# Patient Record
Sex: Female | Born: 1980 | Race: Black or African American | Hispanic: No | Marital: Single | Smoking: Current some day smoker
Health system: Southern US, Community
[De-identification: ages and names within clinical notes are randomized; demographics above are authoritative.]

## PROBLEM LIST (undated history)

## (undated) DIAGNOSIS — T4145XA Adverse effect of unspecified anesthetic, initial encounter: Secondary | ICD-10-CM

## (undated) DIAGNOSIS — Z8614 Personal history of Methicillin resistant Staphylococcus aureus infection: Secondary | ICD-10-CM

## (undated) DIAGNOSIS — Z9889 Other specified postprocedural states: Secondary | ICD-10-CM

## (undated) DIAGNOSIS — I10 Essential (primary) hypertension: Secondary | ICD-10-CM

## (undated) DIAGNOSIS — F419 Anxiety disorder, unspecified: Secondary | ICD-10-CM

## (undated) DIAGNOSIS — T8859XA Other complications of anesthesia, initial encounter: Secondary | ICD-10-CM

## (undated) DIAGNOSIS — R112 Nausea with vomiting, unspecified: Secondary | ICD-10-CM

## (undated) HISTORY — DX: Essential (primary) hypertension: I10

---

## 2004-04-29 ENCOUNTER — Ambulatory Visit: Payer: Self-pay

## 2004-07-09 ENCOUNTER — Observation Stay: Payer: Self-pay | Admitting: Obstetrics & Gynecology

## 2004-07-14 ENCOUNTER — Inpatient Hospital Stay: Payer: Self-pay | Admitting: Obstetrics & Gynecology

## 2008-01-12 ENCOUNTER — Observation Stay: Payer: Self-pay | Admitting: Obstetrics & Gynecology

## 2008-01-17 ENCOUNTER — Inpatient Hospital Stay: Payer: Self-pay | Admitting: Unknown Physician Specialty

## 2009-12-25 ENCOUNTER — Ambulatory Visit: Payer: Self-pay | Admitting: Family Medicine

## 2010-04-19 HISTORY — PX: TUBAL LIGATION: SHX77

## 2010-11-23 ENCOUNTER — Inpatient Hospital Stay: Payer: Self-pay

## 2011-09-01 IMAGING — CR DG ABDOMEN 3V
1 series · 4 of 4 positions shown · non-contrast
Comparison: none

REASON FOR EXAM: abdominal bloating
COMMENTS:

[Series 1: view not recorded · 0.17mm/px · 4 of 4 slices shown]
[im 1/4]
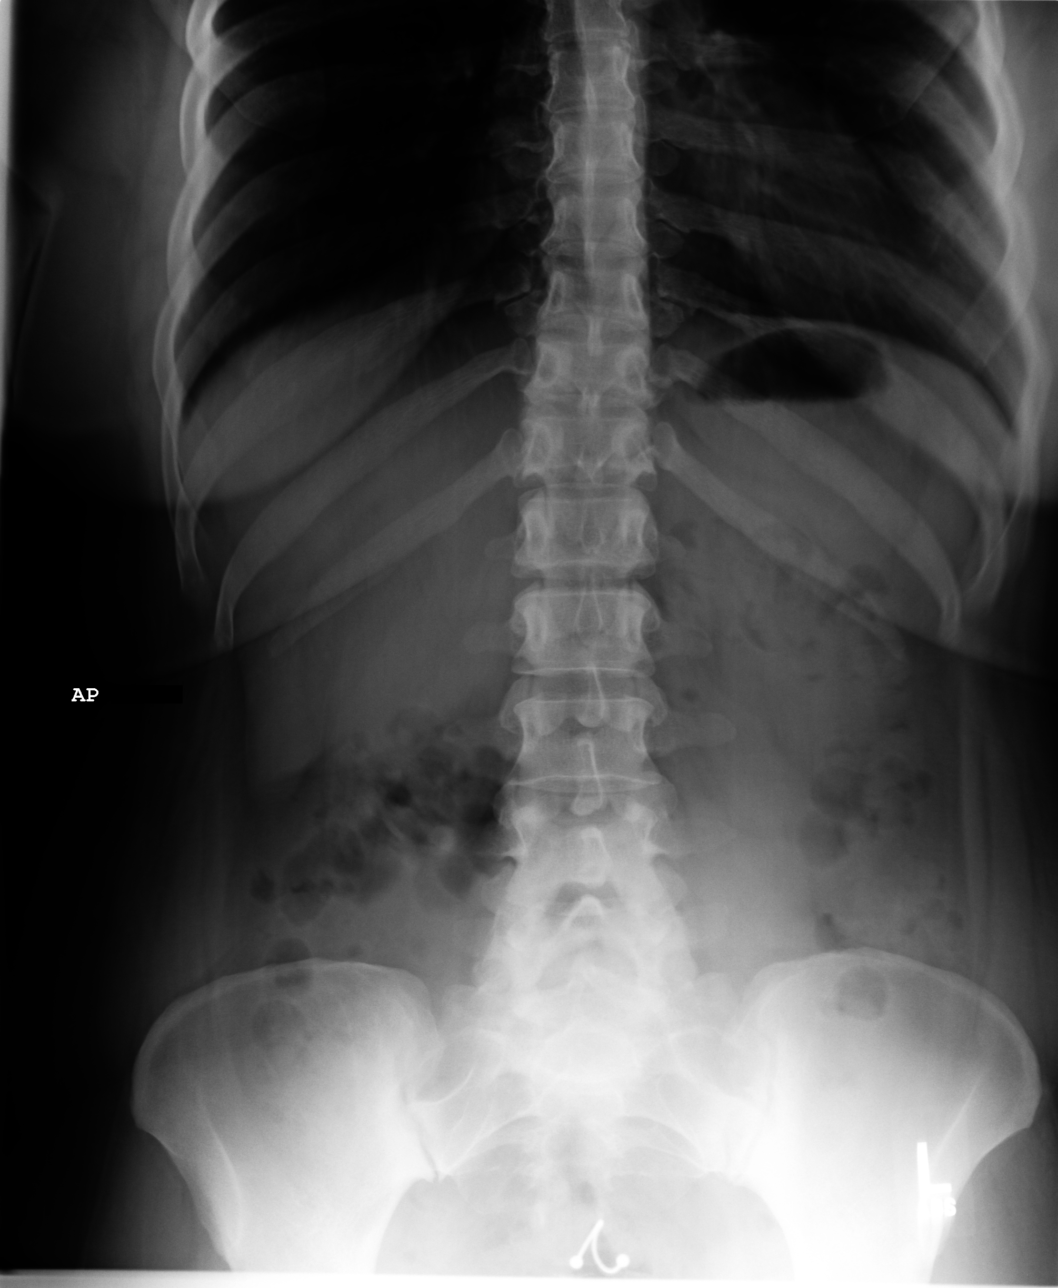
[im 2/4]
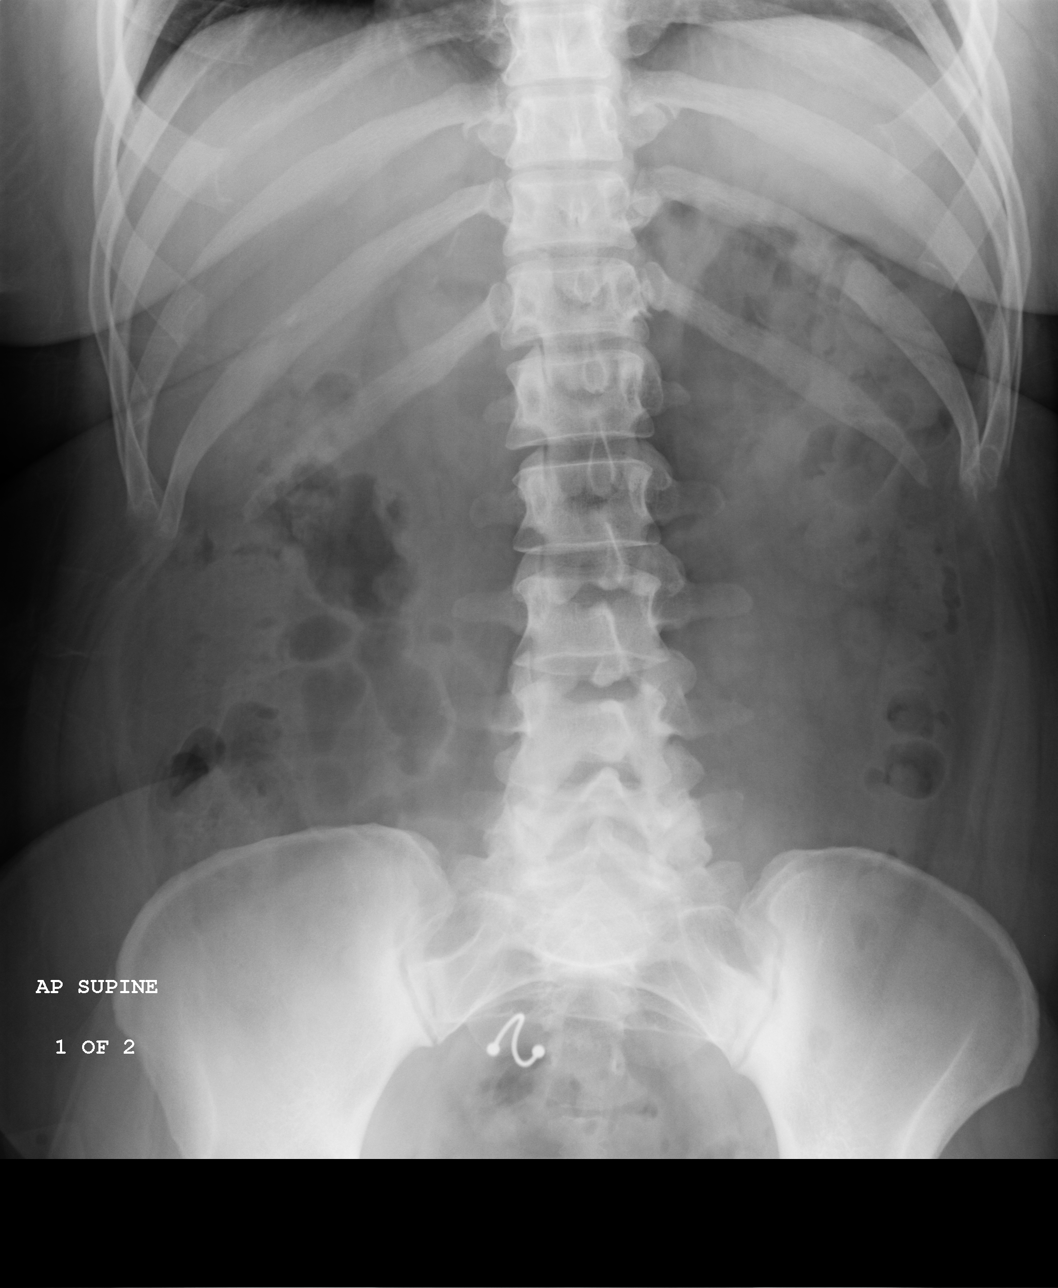
[im 3/4]
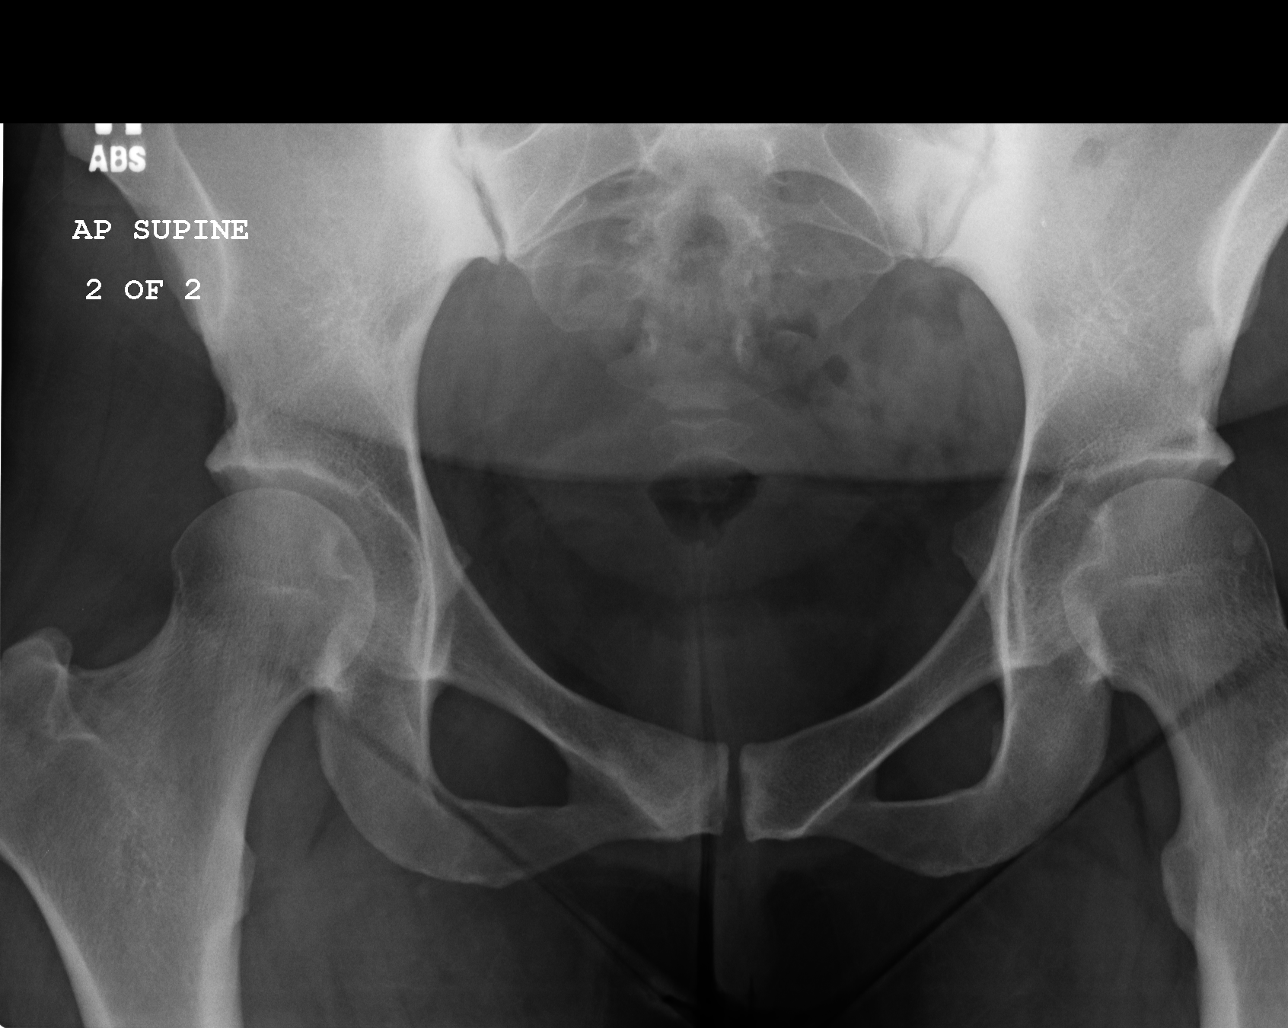
[im 4/4]
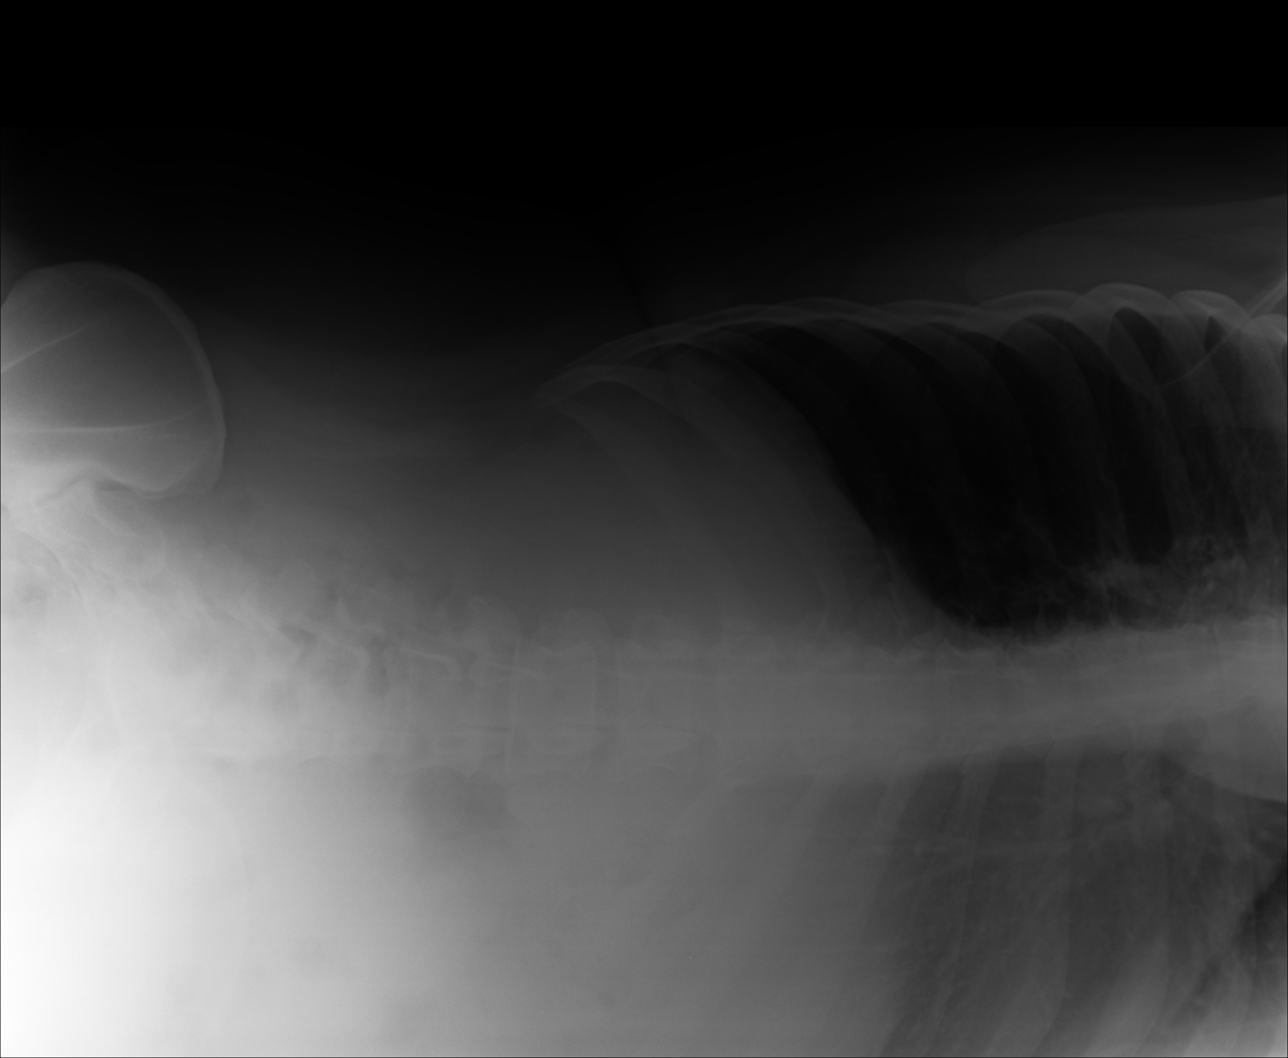

[4 of 4 positions shown; findings below may reference images not displayed]

PROCEDURE:     DXR - DXR ABDOMEN COMPLETE  - December 25, 2009  [DATE]

RESULT:     Flat, erect and lateral decubitus views of the abdomen were
obtained. No subdiaphragmatic free air is seen. The bowel gas pattern is
normal in appearance. No dilated bowel loops suspicious for bowel
obstruction are seen. No abnormal intraabdominal calcifications are noted.
The osseous structures are normal in appearance.
IMPRESSION: 1.     No acute changes are identified.

## 2012-12-29 ENCOUNTER — Emergency Department: Payer: Self-pay | Admitting: Emergency Medicine

## 2012-12-29 LAB — URINALYSIS, COMPLETE
Bacteria: NONE SEEN
Blood: NEGATIVE
Glucose,UR: NEGATIVE mg/dL (ref 0–75)
Ketone: NEGATIVE
Nitrite: NEGATIVE
Ph: 5 (ref 4.5–8.0)
Protein: NEGATIVE
RBC,UR: 2 /HPF (ref 0–5)
Specific Gravity: 1.025 (ref 1.003–1.030)
Squamous Epithelial: 2
WBC UR: 2 /HPF (ref 0–5)

## 2012-12-29 LAB — PREGNANCY, URINE: Pregnancy Test, Urine: NEGATIVE m[IU]/mL

## 2012-12-29 LAB — WET PREP, GENITAL

## 2015-03-07 ENCOUNTER — Emergency Department
Admission: EM | Admit: 2015-03-07 | Discharge: 2015-03-07 | Disposition: A | Discharge status: Home / Self Care | Payer: Medicaid Other | Attending: Emergency Medicine | Admitting: Emergency Medicine

## 2015-03-07 ENCOUNTER — Encounter: Payer: Self-pay | Admitting: Emergency Medicine

## 2015-03-07 DIAGNOSIS — R1013 Epigastric pain: Secondary | ICD-10-CM | POA: Diagnosis present

## 2015-03-07 DIAGNOSIS — K219 Gastro-esophageal reflux disease without esophagitis: Secondary | ICD-10-CM | POA: Insufficient documentation

## 2015-03-07 LAB — BASIC METABOLIC PANEL
Anion gap: 7 (ref 5–15)
BUN: 9 mg/dL (ref 6–20)
CO2: 25 mmol/L (ref 22–32)
Calcium: 8.6 mg/dL — ABNORMAL LOW (ref 8.9–10.3)
Chloride: 106 mmol/L (ref 101–111)
Creatinine, Ser: 0.8 mg/dL (ref 0.44–1.00)
GFR calc Af Amer: >60 mL/min (ref >60)
GFR calc non Af Amer: >60 mL/min (ref >60)
GLUCOSE: 107 mg/dL — AB (ref 65–99)
POTASSIUM: 3.6 mmol/L (ref 3.5–5.1)
Sodium: 138 mmol/L (ref 135–145)

## 2015-03-07 LAB — CBC
HCT: 39 % (ref 35.0–47.0)
Hemoglobin: 13.2 g/dL (ref 12.0–16.0)
MCH: 31.3 pg (ref 26.0–34.0)
MCHC: 33.7 g/dL (ref 32.0–36.0)
MCV: 92.8 fL (ref 80.0–100.0)
Platelets: 200 10*3/uL (ref 150–440)
RBC: 4.2 MIL/uL (ref 3.80–5.20)
RDW: 13.4 % (ref 11.5–14.5)
WBC: 6.3 10*3/uL (ref 3.6–11.0)

## 2015-03-07 LAB — TROPONIN I

## 2015-03-07 MED ORDER — GI COCKTAIL ~~LOC~~
ORAL | Status: AC
  Administered 2015-03-07: 30 mL via ORAL
  Filled 2015-03-07: qty 30

## 2015-03-07 MED ORDER — GI COCKTAIL ~~LOC~~
30.0000 mL | Freq: Once | ORAL | Status: AC
  Administered 2015-03-07: 30 mL via ORAL

## 2015-03-07 MED ORDER — FAMOTIDINE 20 MG PO TABS
ORAL_TABLET | ORAL | Status: AC
  Administered 2015-03-07: 20 mg via ORAL
  Filled 2015-03-07: qty 1

## 2015-03-07 MED ORDER — SUCRALFATE 1 G PO TABS: 1.0000 g | ORAL_TABLET | Freq: Four times a day (QID) | ORAL | Status: DC

## 2015-03-07 MED ORDER — FAMOTIDINE 20 MG PO TABS
20.0000 mg | ORAL_TABLET | Freq: Once | ORAL | Status: AC
  Administered 2015-03-07: 20 mg via ORAL

## 2015-03-07 MED ORDER — FAMOTIDINE 20 MG PO TABS: 20.0000 mg | ORAL_TABLET | Freq: Every day | ORAL | Status: DC

## 2015-03-07 NOTE — ED Provider Notes (Signed)
[facility] Emergency Department Provider Note     Time seen: ----------------------------------------- 8:24 PM on 03/07/2015 -----------------------------------------    I have reviewed the triage vital signs and the nursing notes.   HISTORY  Chief Complaint Abdominal Pain    HPI Monique Maxwell is a 34 y.o. female who presents ER for weeks of intermittent epigastric pain at night and wakes her from sleep. Patient states her sister told her she needed to come to ER to get checked out. Patient states are moist happens when she is laying down or eating. She denies any fevers chills other complaints.   No past medical history on file.  There are no active problems to display for this patient.   Past Surgical History  Procedure Laterality Date  . Tubal ligation      Allergies Review of patient's allergies indicates no known allergies.  Social History Social History  Substance Use Topics  . Smoking status: Never Smoker   . Smokeless tobacco: Never Used  . Alcohol Use: No    Review of Systems Constitutional: Negative for fever. Eyes: Negative for visual changes. ENT: Negative for sore throat. Cardiovascular: Negative for chest pain. Respiratory: Negative for shortness of breath. Gastrointestinal: Positive for abdominal pain Genitourinary: Negative for dysuria. Musculoskeletal: Negative for back pain. Skin: Negative for rash. Neurological: Negative for headaches, focal weakness or numbness.  10-point ROS otherwise negative.  ____________________________________________   PHYSICAL EXAM:  VITAL SIGNS: ED Triage Vitals  Enc Vitals Group     BP 03/07/15 1854 170/82 mmHg     Pulse Rate 03/07/15 1854 113     Resp 03/07/15 1854 18     Temp 03/07/15 1854 98.7 F (37.1 C)     Temp Source 03/07/15 1854 Oral     SpO2 03/07/15 1854 100 %     Weight 03/07/15 1854 150 lb (68.04 kg)     Height 03/07/15 1854 5\' 2"  (1.575 m)     Head Cir --       Peak Flow --      Pain Score --      Pain Loc --      Pain Edu? --      Excl. in GC? --     Constitutional: Alert and oriented. Well appearing and in no distress. Eyes: Conjunctivae are normal. PERRL. Normal extraocular movements. ENT   Head: Normocephalic and atraumatic.   Nose: No congestion/rhinnorhea.   Mouth/Throat: Mucous membranes are moist.   Neck: No stridor. Cardiovascular: Normal rate, regular rhythm. Normal and symmetric distal pulses are present in all extremities. No murmurs, rubs, or gallops. Respiratory: Normal respiratory effort without tachypnea nor retractions. Breath sounds are clear and equal bilaterally. No wheezes/rales/rhonchi. Gastrointestinal: Soft and nontender. No distention. No abdominal bruits.  Musculoskeletal: Nontender with normal range of motion in all extremities. No joint effusions.  No lower extremity tenderness nor edema. Neurologic:  Normal speech and language. No gross focal neurologic deficits are appreciated. Speech is normal. No gait instability. Skin:  Skin is warm, dry and intact. No rash noted. Psychiatric: Mood and affect are normal. Speech and behavior are normal. Patient exhibits appropriate insight and judgment. ____________________________________________  ED COURSE:  Pertinent labs & imaging results that were available during my care of the patient were reviewed by me and considered in my medical decision making (see chart for details). We'll check abdominal labs and reevaluate. Patient clinically with GERD ____________________________________________    LABS (pertinent positives/negatives)  Labs Reviewed  BASIC METABOLIC PANEL -  Abnormal; Notable for the following:    Glucose, Bld 107 (*)    Calcium 8.6 (*)    All other components within normal limits  CBC  TROPONIN I  ____________________________________________  FINAL ASSESSMENT AND PLAN  GERD  Plan: Patient with labs and imaging as dictated above.  Labs are negative, she'll be discharged with Pepcid and Carafate. She was given GI cocktail in the ER. She is stable for outpatient follow-up with her doctor   Emily Filbert, MD   Emily Filbert, MD 03/07/15 2025

## 2015-03-07 NOTE — ED Notes (Signed)
Pt states "for weeks" has had intermittent epigastric pain at night that wakes her from sleep. Pt states "my sister told me i needed to come and get it checked out." pt states pain only happens when she lays down or after eating.

## 2015-03-07 NOTE — Discharge Instructions (Signed)

## 2015-05-30 ENCOUNTER — Emergency Department
Admission: EM | Admit: 2015-05-30 | Discharge: 2015-05-30 | Disposition: A | Discharge status: Home / Self Care | Payer: Medicaid Other | Attending: Emergency Medicine | Admitting: Emergency Medicine

## 2015-05-30 DIAGNOSIS — Z3202 Encounter for pregnancy test, result negative: Secondary | ICD-10-CM | POA: Diagnosis not present

## 2015-05-30 DIAGNOSIS — K8020 Calculus of gallbladder without cholecystitis without obstruction: Secondary | ICD-10-CM | POA: Diagnosis not present

## 2015-05-30 DIAGNOSIS — Z79899 Other long term (current) drug therapy: Secondary | ICD-10-CM | POA: Insufficient documentation

## 2015-05-30 DIAGNOSIS — K8050 Calculus of bile duct without cholangitis or cholecystitis without obstruction: Secondary | ICD-10-CM

## 2015-05-30 DIAGNOSIS — R1011 Right upper quadrant pain: Secondary | ICD-10-CM | POA: Diagnosis present

## 2015-05-30 LAB — CBC
HCT: 40.4 % (ref 35.0–47.0)
HEMOGLOBIN: 13.5 g/dL (ref 12.0–16.0)
MCH: 30.5 pg (ref 26.0–34.0)
MCHC: 33.5 g/dL (ref 32.0–36.0)
MCV: 91.3 fL (ref 80.0–100.0)
PLATELETS: 182 10*3/uL (ref 150–440)
RBC: 4.43 MIL/uL (ref 3.80–5.20)
RDW: 12.9 % (ref 11.5–14.5)
WBC: 4.7 10*3/uL (ref 3.6–11.0)

## 2015-05-30 LAB — COMPREHENSIVE METABOLIC PANEL
ALBUMIN: 4.2 g/dL (ref 3.5–5.0)
ALT: 13 U/L — ABNORMAL LOW (ref 14–54)
ANION GAP: 7 (ref 5–15)
AST: 14 U/L — ABNORMAL LOW (ref 15–41)
Alkaline Phosphatase: 48 U/L (ref 38–126)
BUN: 13 mg/dL (ref 6–20)
CALCIUM: 9 mg/dL (ref 8.9–10.3)
CO2: 26 mmol/L (ref 22–32)
CREATININE: 0.73 mg/dL (ref 0.44–1.00)
Chloride: 104 mmol/L (ref 101–111)
GFR calc non Af Amer: >60 mL/min (ref >60)
Glucose, Bld: 150 mg/dL — ABNORMAL HIGH (ref 65–99)
POTASSIUM: 3.4 mmol/L — AB (ref 3.5–5.1)
SODIUM: 137 mmol/L (ref 135–145)
TOTAL PROTEIN: 7.3 g/dL (ref 6.5–8.1)
Total Bilirubin: 0.3 mg/dL (ref 0.3–1.2)

## 2015-05-30 LAB — URINALYSIS COMPLETE WITH MICROSCOPIC (ARMC ONLY)
BILIRUBIN URINE: NEGATIVE
Bacteria, UA: NONE SEEN
GLUCOSE, UA: NEGATIVE mg/dL
Hgb urine dipstick: NEGATIVE
KETONES UR: NEGATIVE mg/dL
Leukocytes, UA: NEGATIVE
NITRITE: NEGATIVE
PROTEIN: NEGATIVE mg/dL
SPECIFIC GRAVITY, URINE: 1.027 (ref 1.005–1.030)
pH: 5 (ref 5.0–8.0)

## 2015-05-30 LAB — LIPASE, BLOOD: LIPASE: 34 U/L (ref 11–51)

## 2015-05-30 LAB — PREGNANCY, URINE: Preg Test, Ur: NEGATIVE

## 2015-05-30 MED ORDER — ONDANSETRON 4 MG PO TBDP
4.0000 mg | ORAL_TABLET | Freq: Once | ORAL | Status: AC
  Administered 2015-05-30: 4 mg via ORAL
  Filled 2015-05-30: qty 1

## 2015-05-30 MED ORDER — ONDANSETRON HCL 4 MG PO TABS: ORAL_TABLET | ORAL | Status: DC

## 2015-05-30 MED ORDER — DOCUSATE SODIUM 100 MG PO CAPS: ORAL_CAPSULE | ORAL | Status: DC

## 2015-05-30 MED ORDER — HYDROCODONE-ACETAMINOPHEN 5-325 MG PO TABS: 1.0000 | ORAL_TABLET | ORAL | Status: DC | PRN

## 2015-05-30 MED ORDER — HYDROCODONE-ACETAMINOPHEN 5-325 MG PO TABS
2.0000 | ORAL_TABLET | Freq: Once | ORAL | Status: AC
Start: 2015-05-30 — End: 2015-05-30
  Administered 2015-05-30: 2 via ORAL
  Filled 2015-05-30: qty 2

## 2015-05-30 NOTE — ED Provider Notes (Signed)
[facility] Emergency Department Provider Note  ____________________________________________  Time seen: Approximately 4:55 AM  I have reviewed the triage vital signs and the nursing notes.   HISTORY  Chief Complaint Abdominal Pain    HPI Monique Maxwell is a 35 y.o. female presents with right upper quadrant pain.  She reports that she has had intermittent episodes of right upper quadrant pain and epigastric pain for about a year.  She has been seen in this emergency department previously for the same symptoms.  She also saw her primary care doctor recently who scheduled her for an outpatient ultrasound with a [facility] facility.  She obtain this ultrasound yesterday but does not yet have the results.  She reports acute onset of right upper quadrant pain a few hours ago, several hours after eating a meal.  She said that it is now better; it was initially severe but now it is mild.  It is accompanied with nausea but no vomiting.  She denies fever/chills, chest pain, shortness of breath, dysuria.  When it is happening, nothing makes it better and nothing makes it worse, but it did get better on its own.   No past medical history on file.  There are no active problems to display for this patient.   Past Surgical History  Procedure Laterality Date  . Tubal ligation      Current Outpatient Rx  Name  Route  Sig  Dispense  Refill  .           . famotidine (PEPCID) 20 MG tablet   Oral   Take 1 tablet (20 mg total) by mouth daily.   30 tablet   1   .           .           . sucralfate (CARAFATE) 1 G tablet   Oral   Take 1 tablet (1 g total) by mouth 4 (four) times daily.   120 tablet   1     Allergies Review of patient's allergies indicates no known allergies.  No family history on file.  Social History Social History  Substance Use Topics  . Smoking status: Never Smoker   . Smokeless tobacco: Never Used  . Alcohol Use: No    Review of  Systems Constitutional: No fever/chills Eyes: No visual changes. ENT: No sore throat. Cardiovascular: Denies chest pain. Respiratory: Denies shortness of breath. Gastrointestinal: Recurrent right upper quadrant and occasionally epigastric pain with nausea but no vomiting.  No diarrhea. Genitourinary: Negative for dysuria. Musculoskeletal: Negative for back pain. Skin: Negative for rash. Neurological: Negative for headaches, focal weakness or numbness.  10-point ROS otherwise negative.  ____________________________________________   PHYSICAL EXAM:  VITAL SIGNS: ED Triage Vitals  Enc Vitals Group     BP 05/30/15 0240 131/90 mmHg     Pulse Rate 05/30/15 0240 99     Resp 05/30/15 0240 18     Temp 05/30/15 0240 98.2 F (36.8 C)     Temp Source 05/30/15 0240 Oral     SpO2 05/30/15 0240 100 %     Weight 05/30/15 0236 148 lb (67.132 kg)     Height 05/30/15 0236  (1.6 m)     Head Cir --      Peak Flow --      Pain Score 05/30/15 0237 1     Pain Loc --      Pain Edu? --      Excl. in GC? --  Constitutional: Alert and oriented. Well appearing and in no acute distress. Eyes: Conjunctivae are normal. PERRL. EOMI. Head: Atraumatic. Nose: No congestion/rhinnorhea. Mouth/Throat: Mucous membranes are moist.  Oropharynx non-erythematous. Neck: No stridor.   Cardiovascular: Normal rate, regular rhythm. Grossly normal heart sounds.  Good peripheral circulation. Respiratory: Normal respiratory effort.  No retractions. Lungs CTAB. Gastrointestinal: Soft with mild tenderness to palpation of the right upper quadrant but no Murphy sign.  No tenderness to palpation of the lower abdomen.  No epigastric tenderness to palpation. Musculoskeletal: No lower extremity tenderness nor edema.  No joint effusions. Neurologic:  Normal speech and language. No gross focal neurologic deficits are appreciated.  Skin:  Skin is warm, dry and intact. No rash noted. Psychiatric: Mood and affect are  normal. Speech and behavior are normal.  ____________________________________________   LABS (all labs ordered are listed, but only abnormal results are displayed)  Labs Reviewed  COMPREHENSIVE METABOLIC PANEL - Abnormal; Notable for the following:    Potassium 3.4 (*)    Glucose, Bld 150 (*)    AST 14 (*)    ALT 13 (*)    All other components within normal limits  URINALYSIS COMPLETEWITH MICROSCOPIC ([facility] ONLY) - Abnormal; Notable for the following:    Color, Urine YELLOW (*)    APPearance CLEAR (*)    Squamous Epithelial / LPF 0-5 (*)    All other components within normal limits  CBC  LIPASE, BLOOD  PREGNANCY, URINE   ____________________________________________  EKG  None ____________________________________________  RADIOLOGY   No results found.  ____________________________________________   PROCEDURES  Procedure(s) performed: None  Critical Care performed: No ____________________________________________   INITIAL IMPRESSION / ASSESSMENT AND PLAN / ED COURSE  Pertinent labs & imaging results that were available during my care of the patient were reviewed by me and considered in my medical decision making (see chart for details).  Patient's labs are unremarkable and reassuring.  She has no LFT elevation and a normal lipase.  I reviewed her notes in care everywhere and found the results of her ultrasound yesterday.  She has multiple gallstones but no evidence of cholecystitis.  I gave her this information and encouraged very close follow-up with an outpatient surgeon.  She is still having some residual pain at this time but it is mild and she understands the plan for outpatient follow-up and Y emergent or even urgent surgery is not indicated at this time.  I gave her contact information for [facility] and she will call them later today to schedule the next available follow-up appointment.  I gave her prescription for Norco, Zofran, and  Colace.  ____________________________________________  FINAL CLINICAL IMPRESSION(S) / ED DIAGNOSES  Final diagnoses:  Biliary colic      NEW MEDICATIONS STARTED DURING THIS VISIT:  New Prescriptions   DOCUSATE SODIUM (COLACE) 100 MG CAPSULE    Take 1 tablet once or twice daily as needed for constipation while taking narcotic pain medicine   HYDROCODONE-ACETAMINOPHEN (NORCO/VICODIN) 5-325 MG TABLET    Take 1-2 tablets by mouth every 4 (four) hours as needed for moderate pain.   ONDANSETRON (ZOFRAN) 4 MG TABLET    Take 1-2 tabs by mouth every 8 hours as needed for nausea/vomiting     Loleta Rose, MD 05/30/15 0510

## 2015-05-30 NOTE — Discharge Instructions (Signed)
You have been seen in the Emergency Department (ED) for abdominal pain.  Your evaluation suggests that your pain is caused by gallstones.  Fortunately you do not need immediate surgery at this time, but it is important that you follow up with a surgeon as an outpatient; typically surgical removal of the gallbladder is the only thing that will definitively fix your issue.  Read through the included information about a bland diet, and use any prescribed medications as instructed.  Avoid smoking and alcohol use.  When you call the surgery office, explain that your outpatient ultrasound at [facility] showed multiple gallstones and that you would like to discuss having your gallbladder removed with either Dr. Everlene Farrier or one of his colleagues.  Please follow up as instructed above regarding todays emergent visit and the symptoms that are bothering you.  Take Norco as prescribed. Do not drink alcohol, drive or participate in any other potentially dangerous activities while taking this medication as it may make you sleepy. Do not take this medication with any other sedating medications, either prescription or over-the-counter. If you were prescribed Percocet or Vicodin, do not take these with acetaminophen (Tylenol) as it is already contained within these medications.   This medication is an opiate (or narcotic) pain medication and can be habit forming.  Use it as little as possible to achieve adequate pain control.  Do not use or use it with extreme caution if you have a history of opiate abuse or dependence.  If you are on a pain contract with your primary care doctor or a pain specialist, be sure to let them know you were prescribed this medication today from the [facility] Emergency Department.  This medication is intended for your use only - do not give any to anyone else and keep it in a secure place where nobody else, especially children, have access to it.  It will also cause or worsen constipation, so you  may want to consider taking an over-the-counter stool softener while you are taking this medication.  Return to the ED if your abdominal pain worsens or fails to improve, you develop bloody vomiting, bloody diarrhea, you are unable to tolerate fluids due to vomiting, fever greater than 101, or other symptoms that concern you.   Cholelithiasis Cholelithiasis (also called gallstones) is a form of gallbladder disease in which gallstones form in your gallbladder. The gallbladder is an organ that stores bile made in the liver, which helps digest fats. Gallstones begin as small crystals and slowly grow into stones. Gallstone pain occurs when the gallbladder spasms and a gallstone is blocking the duct. Pain can also occur when a stone passes out of the duct.  RISK FACTORS  Being female.   Having multiple pregnancies. Health care providers sometimes advise removing diseased gallbladders before future pregnancies.   Being obese.  Eating a diet heavy in fried foods and fat.   Being older than 60 years and increasing age.   Prolonged use of medicines containing female hormones.   Having diabetes mellitus.   Rapidly losing weight.   Having a family history of gallstones (heredity).  SYMPTOMS  Nausea.   Vomiting.  Abdominal pain.   Yellowing of the skin (jaundice).   Sudden pain. It may persist from several minutes to several hours.  Fever.   Tenderness to the touch. In some cases, when gallstones do not move into the bile duct, people have no pain or symptoms. These are called "silent" gallstones.  TREATMENT Silent gallstones do not  need treatment. In severe cases, emergency surgery may be required. Options for treatment include:  Surgery to remove the gallbladder. This is the most common treatment.  Medicines. These do not always work and may take 6-12 months or more to work.  Shock wave treatment (extracorporeal biliary lithotripsy). In this treatment an ultrasound  machine sends shock waves to the gallbladder to break gallstones into smaller pieces that can pass into the intestines or be dissolved by medicine. HOME CARE INSTRUCTIONS   Only take over-the-counter or prescription medicines for pain, discomfort, or fever as directed by your health care provider.   Follow a low-fat diet until seen again by your health care provider. Fat causes the gallbladder to contract, which can result in pain.   Follow up with your health care provider as directed. Attacks are almost always recurrent and surgery is usually required for permanent treatment.  SEEK IMMEDIATE MEDICAL CARE IF:   Your pain increases and is not controlled by medicines.   You have a fever or persistent symptoms for more than 2-3 days.   You have a fever and your symptoms suddenly get worse.   You have persistent nausea and vomiting.  MAKE SURE YOU:   Understand these instructions.  Will watch your condition.  Will get help right away if you are not doing well or get worse.   This information is not intended to replace advice given to you by your health care provider. Make sure you discuss any questions you have with your health care provider.   Document Released: 04/01/2005 Document Revised: 12/06/2012 Document Reviewed: 09/27/2012 Elsevier Interactive Patient Education [employer]

## 2015-05-30 NOTE — ED Notes (Signed)
Pt in with co ruq pain since last year, had US done yest but has not gotten results.

## 2015-06-04 ENCOUNTER — Other Ambulatory Visit: Payer: Self-pay

## 2015-06-05 ENCOUNTER — Encounter: Payer: Self-pay | Admitting: General Surgery

## 2015-06-05 ENCOUNTER — Ambulatory Visit (INDEPENDENT_AMBULATORY_CARE_PROVIDER_SITE_OTHER): Payer: Medicaid Other | Admitting: General Surgery

## 2015-06-05 VITALS — BP 141/89 | HR 91 | Temp 98.9°F | Ht 63.0 in | Wt 151.6 lb

## 2015-06-05 DIAGNOSIS — R1011 Right upper quadrant pain: Secondary | ICD-10-CM

## 2015-06-05 NOTE — Patient Instructions (Signed)
You have requested to have your Gallbladder removed. We will arrange this to be done on 07/03/15 with Dr. Tonita Cong at [facility].   You will be off from work for approximately 1-2 weeks depending on your recovery.   Please avoid greasy and fried foods if at all possible prior to your scheduled surgery to decrease symptoms until then.  If your pain becomes too severe or will not go away, come to the Emergency Room at [facility]- we have a surgeon on call 24 hours a day, 7 days a week.  Do not take your Diet Pill - 1 week prior to surgery.  Please see the [facility]) pre-care form you have been given today.  If you have any questions or concerns please call our office.

## 2015-06-05 NOTE — Progress Notes (Signed)
Patient ID: Monique Maxwell, female   DOB: Aug 11, 1980, 35 y.o.   MRN: [id number]  CC: ABDOMINAL PAIN  HPI Monique Maxwell is a 35 y.o. female  Presents to clinic today for evaluation of abdominal pain. Patient states she's been having intermittent right-sided abdominal pain over the last year and a half. Partially once every couple weeks she'll have severe right upper quadrant pain that shoots up to her right shoulder from her right upper abdomen at the worst pain causes nausea but never vomiting. She also has a chronic history of constipation and normal for her is having a bowel movement every 2 weeks. She denies any fevers, chills, chest pain, shortness breath, nausea, vomiting or current abdominal pain. She's had numerous workups for this and ultrasound that showed gallstones.  HPI  Past Medical History  Diagnosis Date  . Hypertension     Past Surgical History  Procedure Laterality Date  . Tubal ligation  2012    Family History  Problem Relation Age of Onset  . Hypertension Father   . Cancer Father     Colon Cancer  . Cancer Sister     Ovarian Cancer    Social History Social History  Substance Use Topics  . Smoking status: Current Every Day Smoker -- 0.25 packs/day    Types: Cigarettes  . Smokeless tobacco: Never Used  . Alcohol Use: Yes     Comment: Occasional    No Known Allergies  Current Outpatient Prescriptions  Medication Sig Dispense Refill  . HYDROcodone-acetaminophen (NORCO/VICODIN) 5-325 MG tablet Take 1-2 tablets by mouth every 4 (four) hours as needed for moderate pain. 15 tablet 0  . lisinopril-hydrochlorothiazide (PRINZIDE,ZESTORETIC) 10-12.5 MG tablet Take 1 tablet by mouth daily.  5  . ondansetron (ZOFRAN) 4 MG tablet Take 1-2 tabs by mouth every 8 hours as needed for nausea/vomiting 30 tablet 0  . phentermine (ADIPEX-P) 37.5 MG tablet Take 1 tablet by mouth daily.  1   No current facility-administered medications for this visit.     Review of  Systems A  Multi-point review of systems was asked and was negative except for the findings documented in the history of present illness  Physical Exam Blood pressure 141/89, pulse 91, temperature 98.9 F (37.2 C), temperature source Oral, height  (1.6 m), weight 68.765 kg (151 lb 9.6 oz), last menstrual period 05/04/2015. CONSTITUTIONAL:  No acute distress. EYES: Pupils are equal, round, and reactive to light, Sclera are non-icteric. EARS, NOSE, MOUTH AND THROAT: The oropharynx is clear. The oral mucosa is pink and moist. Hearing is intact to voice. LYMPH NODES:  Lymph nodes in the neck are normal. RESPIRATORY:  Lungs are clear. There is normal respiratory effort, with equal breath sounds bilaterally, and without pathologic use of accessory muscles. CARDIOVASCULAR: Heart is regular without murmurs, gallops, or rubs. GI: The abdomen is soft, nontender, and nondistended. There are no palpable masses. There is no hepatosplenomegaly. There are normal bowel sounds in all quadrants. GU: Rectal deferred.   MUSCULOSKELETAL: Normal muscle strength and tone. No cyanosis or edema.   SKIN: Turgor is good and there are no pathologic skin lesions or ulcers. NEUROLOGIC: Motor and sensation is grossly normal. Cranial nerves are grossly intact. PSYCH:  Oriented to person, place and time. Affect is normal.  Data Reviewed  labs reviewed from the emergency department are all within normal limits , ultrasound was performed at [facility] without images currently visible however the report shows cholelithiasis without cholecystitis. I have personally reviewed the  patient's imaging, laboratory findings and medical records.    Assessment     cholelithiasis     Plan    I had a long conversation with the patient that her symptoms are not classic for gallstones however patient is convinced that it is her gallbladder so the surgery was discussed. I discussed the procedure in detail.  The patient was given  Agricultural engineer.  We discussed the risks and benefits of a laparoscopic cholecystectomy and possible cholangiogram including, but not limited to bleeding, infection, injury to surrounding structures such as the intestine or liver, bile leak, retained gallstones, need to convert to an open procedure, prolonged diarrhea, blood clots such as  DVT, common bile duct injury, anesthesia risks, and possible need for additional procedures.  The likelihood of improvement in symptoms and return to the patient's normal status is good. We discussed the typical post-operative recovery course.    Plan for surgical intervention on March 16th. Patient voiced understanding of this may not relieve her symptoms.     Time spent with the patient was 60 minutes, with more than 50% of the time spent in face-to-face education, counseling and care coordination.     Ricarda Frame, MD FACS General Surgeon 06/05/2015, 3:57 PM

## 2015-06-06 ENCOUNTER — Telehealth: Payer: Self-pay | Admitting: General Surgery

## 2015-06-06 NOTE — Telephone Encounter (Signed)
I have called Pt advise her of pre op date/time and sx date, No answer. I have left a detailed message.  Sx: 07/03/15 with Dr Devoria Albe Cholecystectomy. Pre op: 06/19/15 between 9-1:00pm--Phone.

## 2015-06-09 ENCOUNTER — Telehealth: Payer: Self-pay | Admitting: General Surgery

## 2015-06-09 NOTE — Telephone Encounter (Signed)
Patient has been advised of all surgery information. SX: 07/03/15 with Dr Cleotilde Neer cholecystectomy. Phone pre op: 06/19/15 between 9-1 phone.

## 2015-06-26 ENCOUNTER — Other Ambulatory Visit: Payer: Medicaid Other

## 2015-06-26 ENCOUNTER — Encounter: Payer: Self-pay | Admitting: *Deleted

## 2015-06-26 NOTE — Patient Instructions (Signed)
  Your procedure is scheduled on: 07-03-15 (THURSDAY) Report to [facility] 2ND FLOOR To find out your arrival time please call [phone] between 1PM - 3PM on 07-02-15 [facility](WEDNESDAY)  Remember: Instructions that are not followed completely may result in serious medical risk, up to and including death, or upon the discretion of your surgeon and anesthesiologist your surgery may need to be rescheduled.    _X__ 1. Do not eat food or drink liquids after midnight. No gum chewing or hard candies.     _X__ 2. No Alcohol for 24 hours before or after surgery.   ____ 3. Bring all medications with you on the day of surgery if instructed.    _X__ 4. Notify your doctor if there is any change in your medical condition     (cold, fever, infections).     Do not wear jewelry, make-up, hairpins, clips or nail polish.  Do not wear lotions, powders, or perfumes. You may wear deodorant.  Do not shave 48 hours prior to surgery. Men may shave face and neck.  Do not bring valuables to the hospital.    [facility] is not responsible for any belongings or valuables.               Contacts, dentures or bridgework may not be worn into surgery.  Leave your suitcase in the car. After surgery it may be brought to your room.  For patients admitted to the hospital, discharge time is determined by your reatment team.   Patients discharged the day of surgery will not be allowed to drive home.   Please read over the following fact sheets that you were given:     ____ Take these medicines the morning of surgery with A SIP OF WATER:    1. NONE  2.   3.   4.  5.  6.  ____ Fleet Enema (as directed)   _X__ Use CHG Soap as directed  ____ Use inhalers on the day of surgery  ____ Stop metformin 2 days prior to surgery    ____ Take 1/2 of usual insulin dose the night before surgery and none on the morning of surgery.   ____ Stop Coumadin/Plavix/aspirin-N/A  ____ Stop Anti-inflammatories-NO  NSAIDS OR ASPIRIN PRODUCTS-HYDROCODONE/TYLENOL OK TO TAKE   ____ Stop supplements until after surgery.    ____ Bring C-Pap to the hospital.

## 2015-06-30 ENCOUNTER — Encounter
Admission: RE | Admit: 2015-06-30 | Discharge: 2015-06-30 | Disposition: A | Discharge status: Home / Self Care | Payer: Medicaid Other | Source: Ambulatory Visit | Attending: General Surgery | Admitting: General Surgery

## 2015-06-30 ENCOUNTER — Ambulatory Visit
Admission: RE | Admit: 2015-06-30 | Discharge: 2015-06-30 | Disposition: A | Discharge status: Home / Self Care | Payer: Medicaid Other | Source: Ambulatory Visit | Attending: General Surgery | Admitting: General Surgery

## 2015-06-30 DIAGNOSIS — Z01818 Encounter for other preprocedural examination: Secondary | ICD-10-CM | POA: Diagnosis not present

## 2015-06-30 DIAGNOSIS — Z01812 Encounter for preprocedural laboratory examination: Secondary | ICD-10-CM | POA: Diagnosis present

## 2015-06-30 DIAGNOSIS — Z01810 Encounter for preprocedural cardiovascular examination: Secondary | ICD-10-CM

## 2015-06-30 LAB — SURGICAL PCR SCREEN
MRSA, PCR: NEGATIVE
STAPHYLOCOCCUS AUREUS: NEGATIVE

## 2015-06-30 LAB — POTASSIUM: Potassium: 3.2 mmol/L — ABNORMAL LOW (ref 3.5–5.1)

## 2015-07-03 ENCOUNTER — Encounter: Payer: Self-pay | Admitting: *Deleted

## 2015-07-03 ENCOUNTER — Encounter
Admission: RE | Disposition: A | Discharge status: Home / Self Care | Payer: Self-pay | Source: Ambulatory Visit | Attending: General Surgery

## 2015-07-03 ENCOUNTER — Ambulatory Visit: Payer: Medicaid Other | Admitting: Certified Registered Nurse Anesthetist

## 2015-07-03 ENCOUNTER — Ambulatory Visit
Admission: RE | Admit: 2015-07-03 | Discharge: 2015-07-03 | Disposition: A | Discharge status: Home / Self Care | Payer: Medicaid Other | Source: Ambulatory Visit | Attending: General Surgery | Admitting: General Surgery

## 2015-07-03 DIAGNOSIS — K8010 Calculus of gallbladder with chronic cholecystitis without obstruction: Secondary | ICD-10-CM | POA: Diagnosis present

## 2015-07-03 DIAGNOSIS — Z800 Family history of malignant neoplasm of digestive organs: Secondary | ICD-10-CM | POA: Diagnosis not present

## 2015-07-03 DIAGNOSIS — I10 Essential (primary) hypertension: Secondary | ICD-10-CM | POA: Diagnosis not present

## 2015-07-03 DIAGNOSIS — K5900 Constipation, unspecified: Secondary | ICD-10-CM | POA: Insufficient documentation

## 2015-07-03 DIAGNOSIS — F17210 Nicotine dependence, cigarettes, uncomplicated: Secondary | ICD-10-CM | POA: Insufficient documentation

## 2015-07-03 DIAGNOSIS — Z8249 Family history of ischemic heart disease and other diseases of the circulatory system: Secondary | ICD-10-CM | POA: Insufficient documentation

## 2015-07-03 DIAGNOSIS — Z79899 Other long term (current) drug therapy: Secondary | ICD-10-CM | POA: Diagnosis not present

## 2015-07-03 DIAGNOSIS — R1011 Right upper quadrant pain: Secondary | ICD-10-CM | POA: Diagnosis not present

## 2015-07-03 DIAGNOSIS — K8021 Calculus of gallbladder without cholecystitis with obstruction: Secondary | ICD-10-CM | POA: Diagnosis not present

## 2015-07-03 DIAGNOSIS — Z8041 Family history of malignant neoplasm of ovary: Secondary | ICD-10-CM | POA: Insufficient documentation

## 2015-07-03 DIAGNOSIS — M25511 Pain in right shoulder: Secondary | ICD-10-CM | POA: Insufficient documentation

## 2015-07-03 HISTORY — DX: Other specified postprocedural states: Z98.890

## 2015-07-03 HISTORY — DX: Nausea with vomiting, unspecified: R11.2

## 2015-07-03 HISTORY — DX: Anxiety disorder, unspecified: F41.9

## 2015-07-03 HISTORY — DX: Personal history of Methicillin resistant Staphylococcus aureus infection: Z86.14

## 2015-07-03 HISTORY — DX: Adverse effect of unspecified anesthetic, initial encounter: T41.45XA

## 2015-07-03 HISTORY — DX: Other complications of anesthesia, initial encounter: T88.59XA

## 2015-07-03 HISTORY — PX: ROBOTIC ASSISTED LAPAROSCOPIC CHOLECYSTECTOMY: SHX6521

## 2015-07-03 LAB — POCT I-STAT 4, (NA,K, GLUC, HGB,HCT)
GLUCOSE: 102 mg/dL — AB (ref 65–99)
HEMATOCRIT: 41 % (ref 36.0–46.0)
HEMOGLOBIN: 13.9 g/dL (ref 12.0–15.0)
POTASSIUM: 3.1 mmol/L — AB (ref 3.5–5.1)
Sodium: 139 mmol/L (ref 135–145)

## 2015-07-03 LAB — POCT PREGNANCY, URINE: Preg Test, Ur: NEGATIVE

## 2015-07-03 SURGERY — LAPAROSCOPIC CHOLECYSTECTOMY
Anesthesia: Choice

## 2015-07-03 SURGERY — ROBOTIC ASSISTED LAPAROSCOPIC CHOLECYSTECTOMY
Anesthesia: General | Wound class: Clean Contaminated

## 2015-07-03 MED ORDER — FAMOTIDINE 20 MG PO TABS
20.0000 mg | ORAL_TABLET | Freq: Once | ORAL | Status: AC
  Administered 2015-07-03: 20 mg via ORAL

## 2015-07-03 MED ORDER — PHENYLEPHRINE HCL 10 MG/ML IJ SOLN
INTRAMUSCULAR | Status: DC | PRN
  Administered 2015-07-03 (×4): 100 ug via INTRAVENOUS

## 2015-07-03 MED ORDER — DEXAMETHASONE SODIUM PHOSPHATE 10 MG/ML IJ SOLN
INTRAMUSCULAR | Status: DC | PRN
  Administered 2015-07-03 (×2): 5 mg via INTRAVENOUS

## 2015-07-03 MED ORDER — BUPIVACAINE HCL (PF) 0.5 % IJ SOLN
INTRAMUSCULAR | Status: AC
  Filled 2015-07-03: qty 30

## 2015-07-03 MED ORDER — SUCCINYLCHOLINE CHLORIDE 20 MG/ML IJ SOLN
INTRAMUSCULAR | Status: DC | PRN
  Administered 2015-07-03: 100 mg via INTRAVENOUS

## 2015-07-03 MED ORDER — HYDROCODONE-ACETAMINOPHEN 5-325 MG PO TABS
ORAL_TABLET | ORAL | Status: AC
  Administered 2015-07-03: 1
  Filled 2015-07-03: qty 1

## 2015-07-03 MED ORDER — GLYCOPYRROLATE 0.2 MG/ML IJ SOLN
INTRAMUSCULAR | Status: DC | PRN
  Administered 2015-07-03: 0.4 mg via INTRAVENOUS

## 2015-07-03 MED ORDER — ONDANSETRON HCL 4 MG/2ML IJ SOLN
INTRAMUSCULAR | Status: AC
  Administered 2015-07-03: 4 mg via INTRAVENOUS
  Filled 2015-07-03: qty 2

## 2015-07-03 MED ORDER — LIDOCAINE HCL (PF) 1 % IJ SOLN
INTRAMUSCULAR | Status: AC
  Filled 2015-07-03: qty 30

## 2015-07-03 MED ORDER — FENTANYL CITRATE (PF) 100 MCG/2ML IJ SOLN
INTRAMUSCULAR | Status: AC
  Administered 2015-07-03: 25 ug via INTRAVENOUS
  Filled 2015-07-03: qty 2

## 2015-07-03 MED ORDER — ACETAMINOPHEN 10 MG/ML IV SOLN
INTRAVENOUS | Status: DC | PRN
  Administered 2015-07-03: 1000 mg via INTRAVENOUS

## 2015-07-03 MED ORDER — LIDOCAINE HCL (CARDIAC) 20 MG/ML IV SOLN
INTRAVENOUS | Status: DC | PRN
  Administered 2015-07-03: 80 mg via INTRAVENOUS

## 2015-07-03 MED ORDER — FENTANYL CITRATE (PF) 100 MCG/2ML IJ SOLN
25.0000 ug | INTRAMUSCULAR | Status: DC | PRN
  Administered 2015-07-03 (×4): 25 ug via INTRAVENOUS

## 2015-07-03 MED ORDER — LIDOCAINE HCL (PF) 1 % IJ SOLN
INTRAMUSCULAR | Status: DC | PRN
  Administered 2015-07-03: 9 mL

## 2015-07-03 MED ORDER — INDOCYANINE GREEN 25 MG IV SOLR
1.2500 mg | Freq: Once | INTRAVENOUS | Status: AC
  Administered 2015-07-03: 1.25 mg via INTRAVENOUS

## 2015-07-03 MED ORDER — CHLORHEXIDINE GLUCONATE 4 % EX LIQD: 1.0000 "application " | Freq: Once | CUTANEOUS | Status: DC

## 2015-07-03 MED ORDER — FENTANYL CITRATE (PF) 100 MCG/2ML IJ SOLN
INTRAMUSCULAR | Status: DC | PRN
  Administered 2015-07-03: 50 ug via INTRAVENOUS
  Administered 2015-07-03: 100 ug via INTRAVENOUS

## 2015-07-03 MED ORDER — SODIUM CHLORIDE 0.9 % IR SOLN
Status: DC | PRN
  Administered 2015-07-03: 1000 mL

## 2015-07-03 MED ORDER — INDOCYANINE GREEN 25 MG IV SOLR
INTRAVENOUS | Status: AC
  Filled 2015-07-03: qty 25

## 2015-07-03 MED ORDER — POTASSIUM CHLORIDE CRYS ER 20 MEQ PO TBCR
40.0000 meq | EXTENDED_RELEASE_TABLET | Freq: Once | ORAL | Status: AC
Start: 2015-07-03 — End: 2015-07-03
  Administered 2015-07-03: 40 meq via ORAL
  Filled 2015-07-03: qty 2

## 2015-07-03 MED ORDER — BUPIVACAINE HCL (PF) 0.5 % IJ SOLN
INTRAMUSCULAR | Status: DC | PRN
  Administered 2015-07-03: 9 mL

## 2015-07-03 MED ORDER — ROCURONIUM BROMIDE 100 MG/10ML IV SOLN
INTRAVENOUS | Status: DC | PRN
Start: 2015-07-03 — End: 2015-07-03
  Administered 2015-07-03: 5 mg via INTRAVENOUS
  Administered 2015-07-03: 15 mg via INTRAVENOUS
  Administered 2015-07-03 (×3): 5 mg via INTRAVENOUS

## 2015-07-03 MED ORDER — ONDANSETRON HCL 4 MG/2ML IJ SOLN
4.0000 mg | Freq: Once | INTRAMUSCULAR | Status: AC | PRN
  Administered 2015-07-03: 4 mg via INTRAVENOUS

## 2015-07-03 MED ORDER — LACTATED RINGERS IV SOLN
INTRAVENOUS | Status: DC
  Administered 2015-07-03: 07:00:00 via INTRAVENOUS

## 2015-07-03 MED ORDER — HYDROCODONE-ACETAMINOPHEN 5-325 MG PO TABS: 1.0000 | ORAL_TABLET | ORAL | Status: DC | PRN

## 2015-07-03 MED ORDER — ONDANSETRON HCL 4 MG/2ML IJ SOLN
INTRAMUSCULAR | Status: DC | PRN
  Administered 2015-07-03: 4 mg via INTRAVENOUS

## 2015-07-03 MED ORDER — PROPOFOL 10 MG/ML IV BOLUS
INTRAVENOUS | Status: DC | PRN
  Administered 2015-07-03: 150 mg via INTRAVENOUS

## 2015-07-03 MED ORDER — FAMOTIDINE 20 MG PO TABS
ORAL_TABLET | ORAL | Status: AC
  Filled 2015-07-03: qty 1

## 2015-07-03 MED ORDER — NEOSTIGMINE METHYLSULFATE 10 MG/10ML IV SOLN
INTRAVENOUS | Status: DC | PRN
  Administered 2015-07-03: 3 mg via INTRAVENOUS

## 2015-07-03 MED ORDER — ACETAMINOPHEN 10 MG/ML IV SOLN
INTRAVENOUS | Status: AC
  Filled 2015-07-03: qty 100

## 2015-07-03 MED ORDER — DEXTROSE 5 % IV SOLN
1.0000 g | INTRAVENOUS | Status: AC
  Administered 2015-07-03 (×2): 1 g via INTRAVENOUS
  Filled 2015-07-03 (×2): qty 1

## 2015-07-03 MED ORDER — MIDAZOLAM HCL 2 MG/2ML IJ SOLN
INTRAMUSCULAR | Status: DC | PRN
  Administered 2015-07-03: 2 mg via INTRAVENOUS

## 2015-07-03 SURGICAL SUPPLY — 39 items
BLADE SURG SZ11 CARB STEEL (BLADE) ×3 IMPLANT
CANISTER SUCT 1200ML W/VALVE (MISCELLANEOUS) ×3 IMPLANT
CHLORAPREP W/TINT 26ML (MISCELLANEOUS) ×3 IMPLANT
CLIP LIGATING HEMO O LOK GREEN (MISCELLANEOUS) ×6 IMPLANT
CORD BIP STRL DISP 12FT (MISCELLANEOUS) ×3 IMPLANT
COVER TIP SHEARS 8 DVNC (MISCELLANEOUS) ×1 IMPLANT
COVER TIP SHEARS 8MM DA VINCI (MISCELLANEOUS) ×2
DEFOGGER SCOPE WARMER CLEARIFY (MISCELLANEOUS) ×3 IMPLANT
DRAPE 3 ARM ACCESS DA VINCI (DRAPES) ×2
DRAPE 3 ARM ACCESS DVNC (DRAPES) ×1 IMPLANT
ELECT REM PT RETURN 9FT ADLT (ELECTROSURGICAL) ×3
ELECTRODE REM PT RTRN 9FT ADLT (ELECTROSURGICAL) ×1 IMPLANT
GLOVE BIO SURGEON STRL SZ7.5 (GLOVE) ×12 IMPLANT
GLOVE INDICATOR 8.0 STRL GRN (GLOVE) ×6 IMPLANT
GOWN STRL REUS W/ TWL LRG LVL3 (GOWN DISPOSABLE) ×3 IMPLANT
GOWN STRL REUS W/TWL LRG LVL3 (GOWN DISPOSABLE) ×6
IRRIGATION STRYKERFLOW (MISCELLANEOUS) ×1 IMPLANT
IRRIGATOR STRYKERFLOW (MISCELLANEOUS) ×3
IV NS 1000ML (IV SOLUTION) ×2
IV NS 1000ML BAXH (IV SOLUTION) ×1 IMPLANT
KIT PINK PAD W/HEAD ARE REST (MISCELLANEOUS) ×3
KIT PINK PAD W/HEAD ARM REST (MISCELLANEOUS) ×1 IMPLANT
L-HOOK LAP DISP 36CM (ELECTROSURGICAL)
LHOOK LAP DISP 36CM (ELECTROSURGICAL) IMPLANT
LIQUID BAND (GAUZE/BANDAGES/DRESSINGS) ×3 IMPLANT
NEEDLE VERESS 14GA 120MM (NEEDLE) ×3 IMPLANT
NS IRRIG 500ML POUR BTL (IV SOLUTION) ×3 IMPLANT
PACK LAP CHOLECYSTECTOMY (MISCELLANEOUS) ×3 IMPLANT
PENCIL ELECTRO HAND CTR (MISCELLANEOUS) ×3 IMPLANT
POUCH ENDO CATCH 10MM SPEC (MISCELLANEOUS) ×3 IMPLANT
SCISSORS METZENBAUM CVD 33 (INSTRUMENTS) ×3 IMPLANT
SOLUTION ELECTROLUBE (MISCELLANEOUS) ×3 IMPLANT
SUT MNCRL 4-0 (SUTURE) ×2
SUT MNCRL 4-0 27XMFL (SUTURE) ×1
SUT VICRYL 0 AB UR-6 (SUTURE) ×3 IMPLANT
SUTURE MNCRL 4-0 27XMF (SUTURE) ×1 IMPLANT
TROCAR XCEL 12X100 BLDLESS (ENDOMECHANICALS) ×3 IMPLANT
TROCAR XCEL NON-BLD 5MMX100MML (ENDOMECHANICALS) ×3 IMPLANT
TUBING INSUFFLATOR HI FLOW (MISCELLANEOUS) ×3 IMPLANT

## 2015-07-03 NOTE — Interval H&P Note (Signed)
History and Physical Interval Note:  07/03/2015 6:48 AM  Monique Maxwell  has presented today for surgery, with the diagnosis of abdominal pain  The various methods of treatment have been discussed with the patient and family. After consideration of risks, benefits and other options for treatment, the patient has consented to  Procedure(s): ROBOTIC ASSISTED LAPAROSCOPIC CHOLECYSTECTOMY (N/A) as a surgical intervention .  The patient's history has been reviewed, patient examined, no change in status, stable for surgery.  I have reviewed the patient's chart and labs.  Questions were answered to the patient's satisfaction.     Monique Maxwell

## 2015-07-03 NOTE — Op Note (Signed)
Robotic assisted Laparoscopic Cholecystectomy  Pre-operative Diagnosis: Cholelithiasis  Post-operative Diagnosis: Same  Procedure: Robotic assisted laparoscopic cholecystectomy  Surgeon: Leonette Mostharles T. Tonita CongWoodham, MD FACS  Anesthesia: Gen. with endotracheal tube  Assistant: None  Procedure Details  The patient was seen again in the Holding Room. The benefits, complications, treatment options, and expected outcomes were discussed with the patient. The risks of bleeding, infection, recurrence of symptoms, failure to resolve symptoms, bile duct damage, bile duct leak, retained common bile duct stone, bowel injury, any of which could require further surgery and/or ERCP, stent, or papillotomy were reviewed with the patient. The likelihood of improving the patient's symptoms with return to their baseline status is good.  The patient and/or family concurred with the proposed plan, giving informed consent.  The patient was taken to Operating Room, identified as Cortnie J Resch and the procedure verified as Laparoscopic Cholecystectomy.  A Time Out was held and the above information confirmed.  Prior to the induction of general anesthesia, antibiotic prophylaxis was administered. VTE prophylaxis was in place. General endotracheal anesthesia was then administered and tolerated well. After the induction, the abdomen was prepped with Chloraprep and draped in the sterile fashion. The patient was positioned in the supine position.  Local anesthetic  was injected into the skin near the umbilicus and an incision made. Access to the peritoneum was accomplished using the open technique with visualization of the peritoneal cavity prior to inserting a 12 mm trocar. Pneumoperitoneum was then created with CO2 under direct visualization and tolerated well without any adverse changes in the patient's vital signs. A 5mm port was placed in the periumbilical position and the abdominal cavity was explored.  Two 8-mm ports were  placed in the midclavicular line approximately 5 cm cephalad from the umbilicus. An additional 5 mm trocar was placed in the right lower quadrant under direct visualization as well. All skin incisions  were infiltrated with a local anesthetic agent before making the incision and placing the trocars.   The patient was positioned  in reverse Trendelenburg, tilted slightly to the patient's left. The [employer] robot was then brought into the field and docked to the robotic ports. The robotic insurance were then advanced to our operative field and under direct visualization without difficulty and appropriate spacing for robotic surgery was verified.   The gallbladder was identified, the fundus grasped and retracted cephalad through the most lateral trocar with a Ananias Pilgrimavidson Geck grasper. I then broke scrub and proceeded to the console. Adhesions were lysed bluntly. The infundibulum was grasped and retracted laterally, exposing the peritoneum overlying the triangle of Calot. This was then divided and exposed in a blunt fashion. An anterior cystic artery was identified above the cystic duct and was clearly visualized going into the gallbladder. It was clipped with hemo-lock clips and cut with robotic shears. The duct was then dissected out and was clearly identified going into the gallbladder. A critical view of the cystic duct going into the gallbladder with visible liver posteriorly was obtained.  The duct was then clipped with Hem-o-lok clips and also cut with robotic shears.   The gallbladder was taken from the gallbladder fossa in a retrograde fashion with the electrocautery. The posterior wall the gallbladder was inadvertently entered into with electrocautery in the mid body of the gallbladder. Once the gallbladder was freed from all his tests the liver was placed in the right upper quadrant above the liver using the assistant port and our robotic arms were removed from the abdomen. I  then re-scrubbed and return to  the operative field. The gallbladder was placed in an Endocatch bag. The liver bed was irrigated and inspected. Hemostasis was again confirmed. Copious irrigation was utilized and was repeatedly aspirated until clear.    Inspection of the right upper quadrant was performed. No bleeding, bile duct injury or leak, or bowel injury was noted. All of our trochars removed under direct visualization and the pneumoperitoneum was released.  The umbilical port site was closed with figure-of-eight 0 Vicryl sutures in the visualized fascia. 4-0 subcuticular Monocryl was used to close the skin. Liquid bandage was then applied to all of her operative sites.  The patient was then extubated and brought to the recovery room in stable condition. Sponge, lap, and needle counts were correct at closure and at the conclusion of the case.   Findings: Cholelithiasis  Estimated Blood Loss: 10 mL         Drains: None         Specimens: Gallbladder           Complications: none               Mafalda Mcginniss T. Tonita Cong, MD, FACS

## 2015-07-03 NOTE — H&P (View-Only) (Signed)
Patient ID: Monique Maxwell, female   DOB: 11/17/1980, 34 y.o.   MRN: [id number]  CC: ABDOMINAL PAIN  HPI Monique Maxwell is a 34 y.o. female  Presents to clinic today for evaluation of abdominal pain. Patient states she's been having intermittent right-sided abdominal pain over the last year and a half. Partially once every couple weeks she'll have severe right upper quadrant pain that shoots up to her right shoulder from her right upper abdomen at the worst pain causes nausea but never vomiting. She also has a chronic history of constipation and normal for her is having a bowel movement every 2 weeks. She denies any fevers, chills, chest pain, shortness breath, nausea, vomiting or current abdominal pain. She's had numerous workups for this and ultrasound that showed gallstones.  HPI  Past Medical History  Diagnosis Date  . Hypertension     Past Surgical History  Procedure Laterality Date  . Tubal ligation  2012    Family History  Problem Relation Age of Onset  . Hypertension Father   . Cancer Father     Colon Cancer  . Cancer Sister     Ovarian Cancer    Social History Social History  Substance Use Topics  . Smoking status: Current Every Day Smoker -- 0.25 packs/day    Types: Cigarettes  . Smokeless tobacco: Never Used  . Alcohol Use: Yes     Comment: Occasional    No Known Allergies  Current Outpatient Prescriptions  Medication Sig Dispense Refill  . HYDROcodone-acetaminophen (NORCO/VICODIN) 5-325 MG tablet Take 1-2 tablets by mouth every 4 (four) hours as needed for moderate pain. 15 tablet 0  . lisinopril-hydrochlorothiazide (PRINZIDE,ZESTORETIC) 10-12.5 MG tablet Take 1 tablet by mouth daily.  5  . ondansetron (ZOFRAN) 4 MG tablet Take 1-2 tabs by mouth every 8 hours as needed for nausea/vomiting 30 tablet 0  . phentermine (ADIPEX-P) 37.5 MG tablet Take 1 tablet by mouth daily.  1   No current facility-administered medications for this visit.     Review of  Systems A  Multi-point review of systems was asked and was negative except for the findings documented in the history of present illness  Physical Exam Blood pressure 141/89, pulse 91, temperature 98.9 F (37.2 C), temperature source Oral, height 5' 3" (1.6 m), weight 68.765 kg (151 lb 9.6 oz), last menstrual period 05/04/2015. CONSTITUTIONAL:  No acute distress. EYES: Pupils are equal, round, and reactive to light, Sclera are non-icteric. EARS, NOSE, MOUTH AND THROAT: The oropharynx is clear. The oral mucosa is pink and moist. Hearing is intact to voice. LYMPH NODES:  Lymph nodes in the neck are normal. RESPIRATORY:  Lungs are clear. There is normal respiratory effort, with equal breath sounds bilaterally, and without pathologic use of accessory muscles. CARDIOVASCULAR: Heart is regular without murmurs, gallops, or rubs. GI: The abdomen is soft, nontender, and nondistended. There are no palpable masses. There is no hepatosplenomegaly. There are normal bowel sounds in all quadrants. GU: Rectal deferred.   MUSCULOSKELETAL: Normal muscle strength and tone. No cyanosis or edema.   SKIN: Turgor is good and there are no pathologic skin lesions or ulcers. NEUROLOGIC: Motor and sensation is grossly normal. Cranial nerves are grossly intact. PSYCH:  Oriented to person, place and time. Affect is normal.  Data Reviewed  labs reviewed from the emergency department are all within normal limits , ultrasound was performed at [facility] without images currently visible however the report shows cholelithiasis without cholecystitis. I have personally reviewed the   patient's imaging, laboratory findings and medical records.    Assessment     cholelithiasis     Plan    I had a long conversation with the patient that her symptoms are not classic for gallstones however patient is convinced that it is her gallbladder so the surgery was discussed. I discussed the procedure in detail.  The patient was given  Agricultural engineereducational material.  We discussed the risks and benefits of a laparoscopic cholecystectomy and possible cholangiogram including, but not limited to bleeding, infection, injury to surrounding structures such as the intestine or liver, bile leak, retained gallstones, need to convert to an open procedure, prolonged diarrhea, blood clots such as  DVT, common bile duct injury, anesthesia risks, and possible need for additional procedures.  The likelihood of improvement in symptoms and return to the patient's normal status is good. We discussed the typical post-operative recovery course.    Plan for surgical intervention on March 16th. Patient voiced understanding of this may not relieve her symptoms.     Time spent with the patient was 60 minutes, with more than 50% of the time spent in face-to-face education, counseling and care coordination.     Ricarda Frameharles Kathreen Dileo, MD FACS General Surgeon 06/05/2015, 3:57 PM

## 2015-07-03 NOTE — Anesthesia Procedure Notes (Signed)
Procedure Name: Intubation Date/Time: 07/03/2015 7:48 AM Performed by: Ginger CarneMICHELET, Annastasia Haskins Pre-anesthesia Checklist: Patient identified, Emergency Drugs available, Suction available and Patient being monitored Patient Re-evaluated:Patient Re-evaluated prior to inductionOxygen Delivery Method: Circle system utilized Preoxygenation: Pre-oxygenation with 100% oxygen Intubation Type: IV induction Ventilation: Mask ventilation without difficulty Laryngoscope Size: Miller and 2 Grade View: Grade I Tube type: Oral Tube size: 7.0 mm Number of attempts: 1 Airway Equipment and Method: Stylet Placement Confirmation: ETT inserted through vocal cords under direct vision,  positive ETCO2 and breath sounds checked- equal and bilateral Secured at: 20 cm Tube secured with: Tape Dental Injury: Teeth and Oropharynx as per pre-operative assessment

## 2015-07-03 NOTE — Brief Op Note (Signed)
07/03/2015  9:55 AM  PATIENT:  Monique Maxwell  35 y.o. female  PRE-OPERATIVE DIAGNOSIS:  abdominal pain  POST-OPERATIVE DIAGNOSIS:  abdominal pain  PROCEDURE:  Procedure(s): ROBOTIC ASSISTED LAPAROSCOPIC CHOLECYSTECTOMY (N/A)  SURGEON:  Surgeon(s) and Role:    * Ricarda Frameharles Chaselyn Nanney, MD - Primary  PHYSICIAN ASSISTANT:   ASSISTANTS: none   ANESTHESIA:   general  EBL:  Total I/O In: 1000 [I.V.:1000] Out: 5 [Blood:5]  BLOOD ADMINISTERED:none  DRAINS: none   LOCAL MEDICATIONS USED:  MARCAINE    and XYLOCAINE   SPECIMEN:  Source of Specimen:  gallbladder  DISPOSITION OF SPECIMEN:  PATHOLOGY  COUNTS:  YES  TOURNIQUET:  * No tourniquets in log *  DICTATION: .Dragon Dictation  PLAN OF CARE: Discharge to home after PACU  PATIENT DISPOSITION:  PACU - hemodynamically stable.   Delay start of Pharmacological VTE agent (>24hrs) due to surgical blood loss or risk of bleeding: not applicable

## 2015-07-03 NOTE — Transfer of Care (Signed)
Immediate Anesthesia Transfer of Care Note  Patient: Monique Maxwell  Procedure(s) Performed: Procedure(s): ROBOTIC ASSISTED LAPAROSCOPIC CHOLECYSTECTOMY (N/A)  Patient Location: PACU  Anesthesia Type:General  Level of Consciousness: sedated  Airway & Oxygen Therapy: Patient Spontanous Breathing and Patient connected to face mask oxygen  Post-op Assessment: Report given to RN and Post -op Vital signs reviewed and stable  Post vital signs: Reviewed and stable  Last Vitals:  Filed Vitals:   07/03/15 0611  BP: 141/82  Pulse: 95  Temp: 36.9 C  Resp: 16    Complications: No apparent anesthesia complications

## 2015-07-03 NOTE — Anesthesia Preprocedure Evaluation (Signed)
Anesthesia Evaluation  Patient identified by MRN, date of birth, ID band Patient awake    Reviewed: Allergy & Precautions, NPO status , Patient's Chart, lab work & pertinent test results  History of Anesthesia Complications (+) PONV and history of anesthetic complications  Airway Mallampati: III  TM Distance: >3 FB Neck ROM: Full    Dental no notable dental hx.    Pulmonary Current Smoker,    Pulmonary exam normal breath sounds clear to auscultation       Cardiovascular hypertension, Pt. on medications Normal cardiovascular exam     Neuro/Psych Anxiety negative neurological ROS     GI/Hepatic negative GI ROS, Neg liver ROS,   Endo/Other  negative endocrine ROS  Renal/GU negative Renal ROS  negative genitourinary   Musculoskeletal negative musculoskeletal ROS (+)   Abdominal Normal abdominal exam  (+)   Peds negative pediatric ROS (+)  Hematology negative hematology ROS (+)   Anesthesia Other Findings   Reproductive/Obstetrics negative OB ROS                             Anesthesia Physical Anesthesia Plan  ASA: II  Anesthesia Plan: General   Post-op Pain Management:    Induction: Intravenous  Airway Management Planned: Oral ETT  Additional Equipment:   Intra-op Plan:   Post-operative Plan: Extubation in OR  Informed Consent: I have reviewed the patients History and Physical, chart, labs and discussed the procedure including the risks, benefits and alternatives for the proposed anesthesia with the patient or authorized representative who has indicated his/her understanding and acceptance.   Dental advisory given  Plan Discussed with: CRNA and Surgeon  Anesthesia Plan Comments:         Anesthesia Quick Evaluation

## 2015-07-03 NOTE — Discharge Instructions (Signed)
Laparoscopic Cholecystectomy, Care After Refer to this sheet in the next few weeks. These instructions provide you with information about caring for yourself after your procedure. Your health care provider may also give you more specific instructions. Your treatment has been planned according to current medical practices, but problems sometimes occur. Call your health care provider if you have any problems or questions after your procedure. WHAT TO EXPECT AFTER THE PROCEDURE After your procedure, it is common to have:  Pain at your incision sites. You will be given pain medicines to control your pain.  Mild nausea or vomiting. This should improve after the first 24 hours.  Bloating and possible shoulder pain from the gas that was used during the procedure. This will improve after the first 24 hours. HOME CARE INSTRUCTIONS Incision Care  Follow instructions from your health care provider about how to take care of your incisions. Make sure you:  Wash your hands with soap and water before you change your bandage (dressing). If soap and water are not available, use hand sanitizer.  Change your dressing as told by your health care provider.  Leave stitches (sutures), skin glue, or adhesive strips in place. These skin closures may need to be in place for 2 weeks or longer. If adhesive strip edges start to loosen and curl up, you may trim the loose edges. Do not remove adhesive strips completely unless your health care provider tells you to do that.  Do not take baths, swim, or use a hot tub until your health care provider approves. Ask your health care provider if you can take showers. You may only be allowed to take sponge baths for bathing. OK to shower in 24 hours General Instructions  Take over-the-counter and prescription medicines only as told by your health care provider.  Do not drive or operate heavy machinery while taking prescription pain medicine.  Return to your normal diet as told  by your health care provider.  Do not lift anything that is heavier than 10 lb (4.5 kg).  Do not play contact sports for one week or until your health care provider approves. SEEK MEDICAL CARE IF:   You have redness, swelling, or pain at the site of your incision.  You have fluid, blood, or pus coming from your incision.  You notice a bad smell coming from your incision area.  Your surgical incisions break open.  You have a fever. SEEK IMMEDIATE MEDICAL CARE IF:  You develop a rash.  You have difficulty breathing.  You have chest pain.  You have increasing pain in your shoulders (shoulder strap areas).  You faint or have dizzy episodes while you are standing.  You have severe pain in your abdomen.  You have nausea or vomiting that lasts for more than one day.   This information is not intended to replace advice given to you by your health care provider. Make sure you discuss any questions you have with your health care provider.   Document Released: 04/05/2005 Document Revised: 12/25/2014 Document Reviewed: 11/15/2012 Elsevier Interactive Patient Education 2016 [employer]   AMBULATORY SURGERY  DISCHARGE INSTRUCTIONS   1) The drugs that you were given will stay in your system until tomorrow so for the next 24 hours you should not:  A) Drive an automobile B) Make any legal decisions C) Drink any alcoholic beverage   2) You may resume regular meals tomorrow.  Today it is better to start with liquids and gradually work up to solid foods.  You may eat anything you prefer, but it is better to start with liquids, then soup and crackers, and gradually work up to solid foods.   3) Please notify your doctor immediately if you have any unusual bleeding, trouble breathing, redness and pain at the surgery site, drainage, fever, or pain not relieved by medication.    4) Additional Instructions:        Please contact your physician with any problems or Same Day  Surgery at [phone], Monday through Friday 6 am to 4 pm, or [place] at [facility] Main number at [phone].

## 2015-07-04 LAB — SURGICAL PATHOLOGY

## 2015-07-04 NOTE — Anesthesia Postprocedure Evaluation (Signed)
Anesthesia Post Note  Patient: Monique Maxwell  Procedure(s) Performed: Procedure(s) (LRB): ROBOTIC ASSISTED LAPAROSCOPIC CHOLECYSTECTOMY (N/A)  Patient location during evaluation: PACU Anesthesia Type: General Level of consciousness: awake and alert and oriented Pain management: pain level controlled Vital Signs Assessment: post-procedure vital signs reviewed and stable Respiratory status: spontaneous breathing Cardiovascular status: blood pressure returned to baseline Anesthetic complications: no    Last Vitals:  Filed Vitals:   07/03/15 1104 07/03/15 1116  BP: 131/84 139/83  Pulse: 68 74  Temp: 37.1 C 35.9 C  Resp: 13 16    Last Pain:  Filed Vitals:   07/03/15 1118  PainSc: 4                  Julann Mcgilvray

## 2015-07-21 ENCOUNTER — Encounter: Payer: Self-pay | Admitting: General Surgery

## 2015-07-24 ENCOUNTER — Ambulatory Visit (INDEPENDENT_AMBULATORY_CARE_PROVIDER_SITE_OTHER): Payer: Medicaid Other | Admitting: General Surgery

## 2015-07-24 ENCOUNTER — Encounter (INDEPENDENT_AMBULATORY_CARE_PROVIDER_SITE_OTHER): Payer: Self-pay

## 2015-07-24 ENCOUNTER — Encounter: Payer: Self-pay | Admitting: General Surgery

## 2015-07-24 VITALS — BP 142/89 | HR 87 | Temp 97.0°F | Wt 154.0 lb

## 2015-07-24 DIAGNOSIS — Z4889 Encounter for other specified surgical aftercare: Secondary | ICD-10-CM

## 2015-07-24 NOTE — Patient Instructions (Signed)

## 2015-07-24 NOTE — Progress Notes (Signed)
Outpatient Surgical Follow Up  07/24/2015  Monique Maxwell is an 35 y.o. female.   Chief Complaint  Patient presents with  . Routine Post Op    Robotic Cholecystectomy Dr. Tonita CongWoodham 07/03/2015    HPI: 35 year old female returns to clinic 3 weeks status post a robotic assisted laparoscopic cholecystectomy. Patient states she's having no pain. She is eating well and having regular bowel movements. She denies any fevers, chills, nausea, vomiting. She's been very happy with her surgical results.  Past Medical History  Diagnosis Date  . Hypertension   . Anxiety   . Complication of anesthesia   . PONV (postoperative nausea and vomiting)   . Hx MRSA infection     Past Surgical History  Procedure Laterality Date  . Tubal ligation  2012  . Robotic assisted laparoscopic cholecystectomy N/A 07/03/2015    Procedure: ROBOTIC ASSISTED LAPAROSCOPIC CHOLECYSTECTOMY;  Surgeon: Ricarda Frameharles Anna-Marie Coller, MD;  Location: [facility] ORS;  Service: General;  Laterality: N/A;    Family History  Problem Relation Age of Onset  . Hypertension Father   . Cancer Father     Colon Cancer  . Cancer Sister     Ovarian Cancer    Social History:  reports that she has been smoking Cigarettes.  She has a 2.25 pack-year smoking history. She has never used smokeless tobacco. She reports that she drinks alcohol. She reports that she does not use illicit drugs.  Allergies: No Known Allergies  Medications reviewed.    ROS A multipoint review of systems was completed, all pertinent positives and negatives are documented within the history of present illness and the remainder negative.   BP 142/89 mmHg  Pulse 87  Temp(Src) 97 F (36.1 C) (Oral)  Wt 69.854 kg (154 lb)  Physical Exam Gen.: No acute distress Chest: Clear to auscultation Heart: Regular rhythm Abdomen: Soft, nontender, nondistended. Well approximated laparoscopic incision sites without evidence of infection or drainage.    No results found for this or  any previous visit (from the past 48 hour(s)). No results found.  Assessment/Plan:  1. Aftercare following surgery 35 year old female status post robotic assisted laparoscopic cholecystectomy. Pathology reviewed which showed chronic cholecystitis. Discussed the signs and symptoms of infection or herniation of any of her incision sites and return to clinic immediately should they occur. Otherwise she'll follow-up in clinic on an as-needed basis.     Ricarda Frameharles Ilina Xu, MD FACS General Surgeon  07/24/2015,10:03 AM

## 2017-03-06 IMAGING — CR DG CHEST 2V
1 series · 2 of 2 positions shown · non-contrast
Comparison: None.

CLINICAL DATA: Preop for cholecystectomy 07/03/2015.

EXAM:
CHEST  2 VIEW

[Series 1: w chest pa · 0.14mm/px · 2 of 2 slices shown]
[im 1/2]
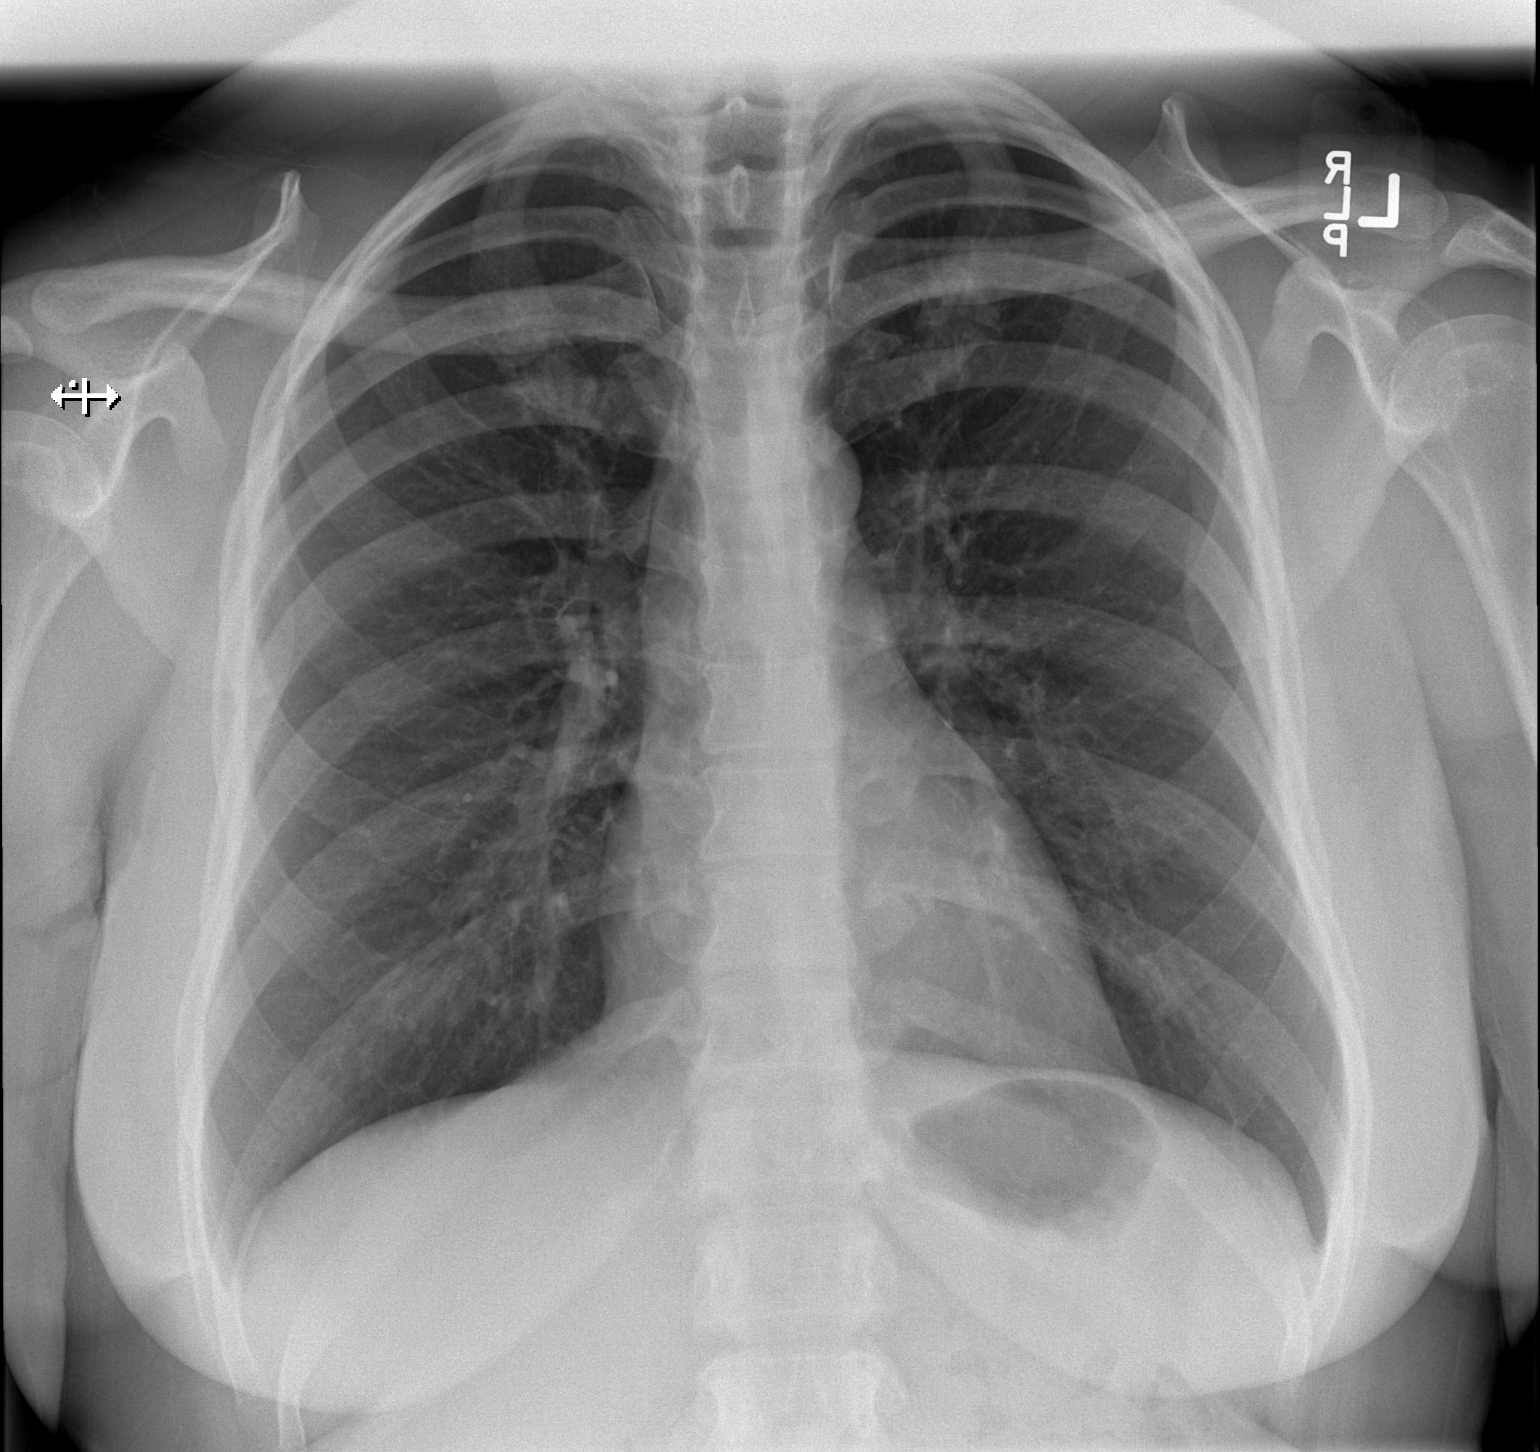
[im 2/2]
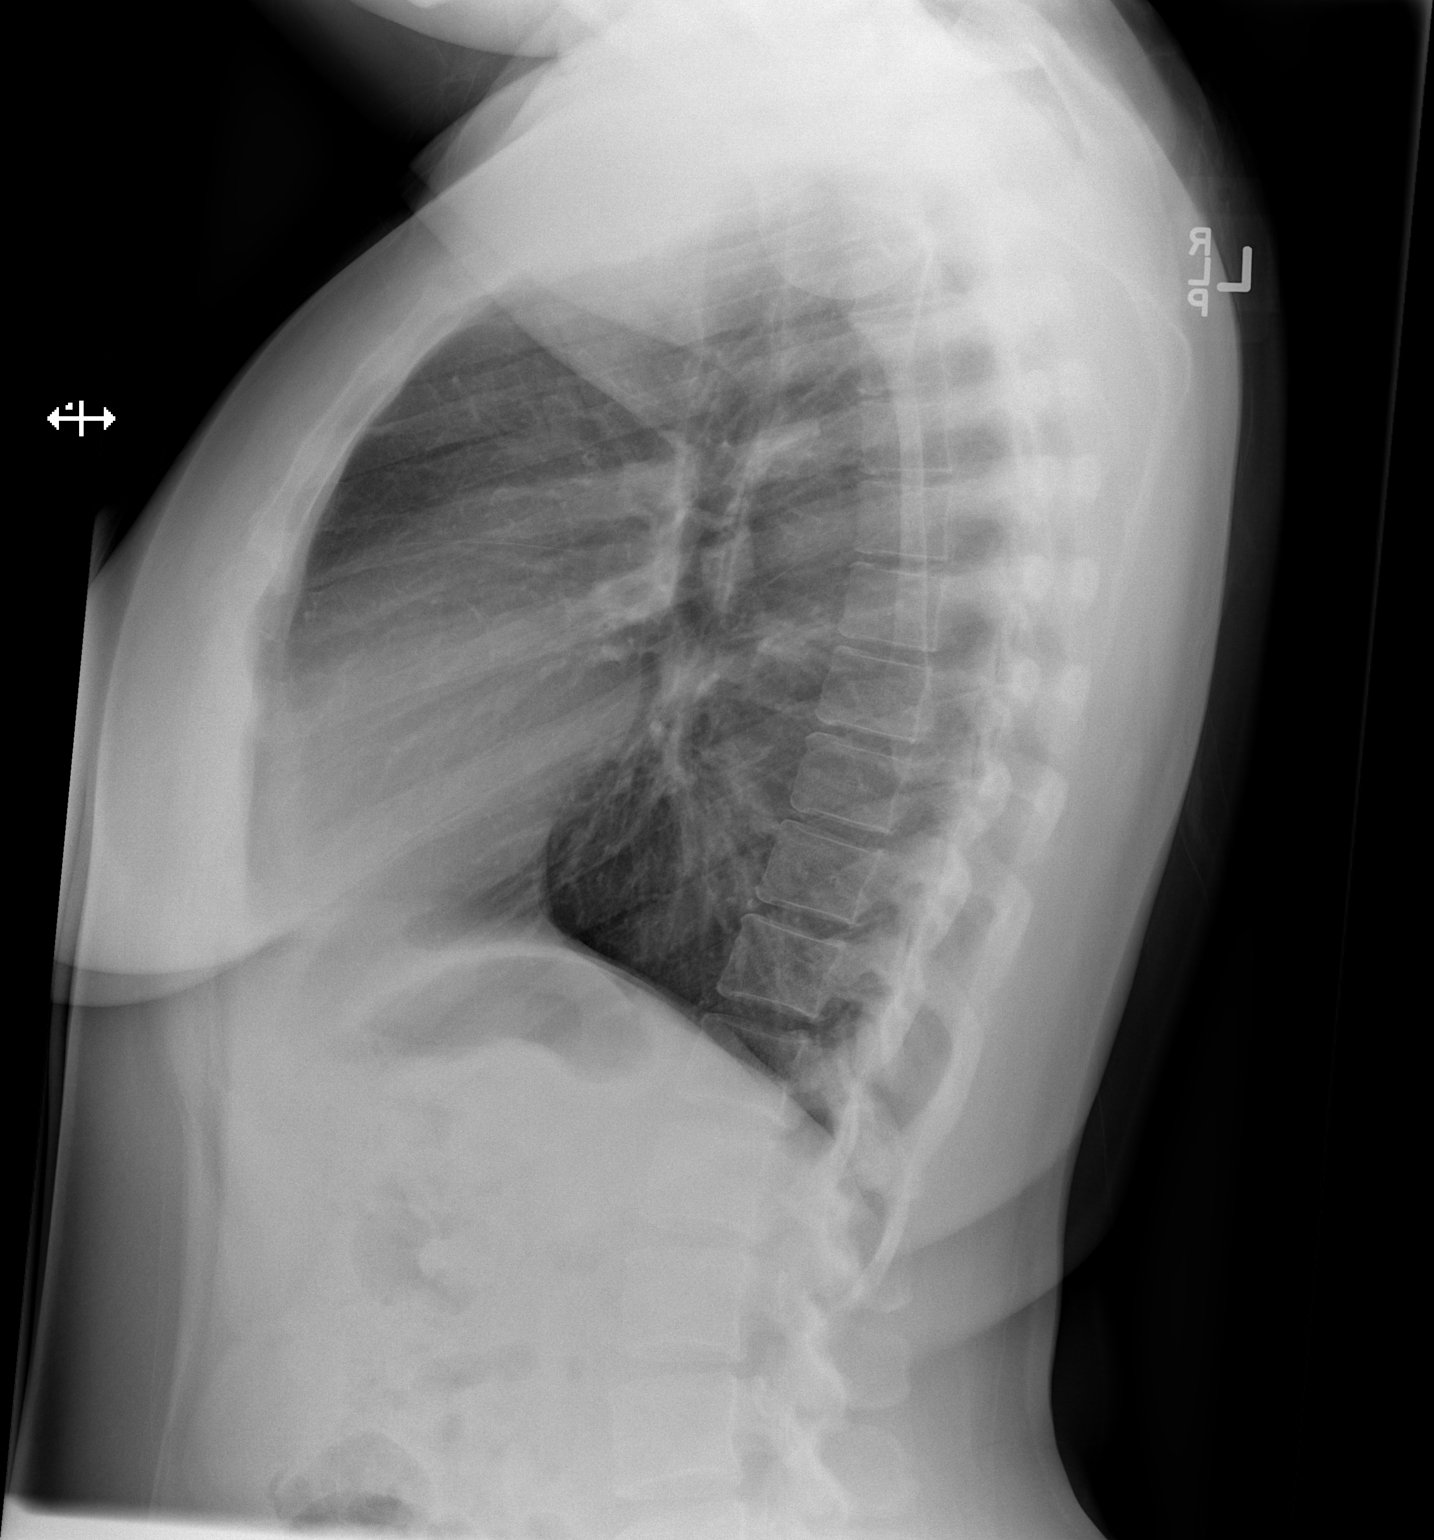

[2 of 2 positions shown; findings below may reference images not displayed]

FINDINGS: The heart size and mediastinal contours are within normal limits.
Both lungs are clear. The visualized skeletal structures are
unremarkable.
IMPRESSION: Normal chest x-ray.

## 2018-05-05 ENCOUNTER — Ambulatory Visit: Payer: Medicaid Other | Admitting: Podiatry

## 2018-05-16 ENCOUNTER — Ambulatory Visit: Payer: Medicaid Other | Admitting: Podiatry

## 2018-05-16 ENCOUNTER — Ambulatory Visit (INDEPENDENT_AMBULATORY_CARE_PROVIDER_SITE_OTHER): Payer: Medicaid Other

## 2018-05-16 ENCOUNTER — Encounter: Payer: Self-pay | Admitting: Podiatry

## 2018-05-16 ENCOUNTER — Other Ambulatory Visit: Payer: Self-pay | Admitting: Podiatry

## 2018-05-16 VITALS — BP 112/76 | HR 79

## 2018-05-16 DIAGNOSIS — M7751 Other enthesopathy of right foot: Secondary | ICD-10-CM

## 2018-05-16 DIAGNOSIS — M19071 Primary osteoarthritis, right ankle and foot: Secondary | ICD-10-CM

## 2018-05-16 DIAGNOSIS — M778 Other enthesopathies, not elsewhere classified: Secondary | ICD-10-CM

## 2018-05-16 DIAGNOSIS — M779 Enthesopathy, unspecified: Secondary | ICD-10-CM

## 2018-05-16 DIAGNOSIS — M79671 Pain in right foot: Secondary | ICD-10-CM

## 2018-05-17 ENCOUNTER — Telehealth: Payer: Self-pay | Admitting: *Deleted

## 2018-05-17 NOTE — Telephone Encounter (Signed)
"  I'm calling to schedule my surgery date with Dr. Logan Bores.  I saw him yesterday and they told me to call you to schedule a date."  Dr. Michel Harrow next available date is March 12.  "Is that the earliest time?  I was thinking sometime in February."  Yes, that is the earliest time.  "Okay, put me down for that date."  I'll get it scheduled.  Someone from the surgical center will give you a call a day or two prior to your surgery date and they will give you your arrival time.

## 2018-05-23 NOTE — Progress Notes (Signed)
   HPI: 38 year old female presenting today as a new patient with a chief complaint of an intermittently painful nodule located on the dorsal aspect of the right foot that began a few years ago. She describes the pain as sharp and shooting and denies modifying factors. She has been taking OTC pain medication for treatment. Patient is here for further evaluation and treatment.   Past Medical History:  Diagnosis Date  . Anxiety   . Complication of anesthesia   . Hx MRSA infection   . Hypertension   . PONV (postoperative nausea and vomiting)      Physical Exam: General: The patient is alert and oriented x3 in no acute distress.  Dermatology: Skin is warm, dry and supple bilateral lower extremities. Negative for open lesions or macerations.  Vascular: Palpable pedal pulses bilaterally. No edema or erythema noted. Capillary refill within normal limits.  Neurological: Epicritic and protective threshold grossly intact bilaterally.   Musculoskeletal Exam: Range of motion within normal limits to all pedal and ankle joints bilateral. Muscle strength 5/5 in all groups bilateral. Prominence at the 1st met cuneiform joint with tenderness to palpation.    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     Assessment: 1. 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 Patient evaluated. X-Rays reviewed.  2. Today we discussed the conservative versus surgical management of the presenting pathology. The patient opts for surgical management. All possible complications and details of the procedure were explained. All patient questions were answered. No guarantees were expressed or implied. 3. Authorization for surgery was initiated today. Surgery will consist of 1st met-cuneiform exostectomy right.  4. CAM boot dispensed.  5. Return to clinic one week post op.      Edrick Kins, DPM [facility]  Dr. Edrick Kins, DPM    [address], [place] [address]                Office [phone]  Fax [phone]

## 2018-06-29 DIAGNOSIS — M2570 Osteophyte, unspecified joint: Secondary | ICD-10-CM | POA: Diagnosis not present

## 2018-07-03 ENCOUNTER — Encounter: Payer: Self-pay | Admitting: Podiatry

## 2018-07-03 ENCOUNTER — Telehealth: Payer: Self-pay

## 2018-07-03 NOTE — Telephone Encounter (Signed)
Called pt post-surgery; Left VM to elevate and stay off foot as much as possible and to call if any questions before first post op visit.

## 2018-07-07 ENCOUNTER — Other Ambulatory Visit: Payer: Self-pay

## 2018-07-07 ENCOUNTER — Encounter: Payer: Self-pay | Admitting: Podiatry

## 2018-07-07 ENCOUNTER — Ambulatory Visit (INDEPENDENT_AMBULATORY_CARE_PROVIDER_SITE_OTHER): Payer: Medicaid Other

## 2018-07-07 ENCOUNTER — Ambulatory Visit (INDEPENDENT_AMBULATORY_CARE_PROVIDER_SITE_OTHER): Payer: Self-pay | Admitting: Podiatry

## 2018-07-07 VITALS — BP 144/84 | HR 84 | Temp 97.9°F

## 2018-07-07 DIAGNOSIS — Z9889 Other specified postprocedural states: Secondary | ICD-10-CM

## 2018-07-07 DIAGNOSIS — M7751 Other enthesopathy of right foot: Secondary | ICD-10-CM | POA: Diagnosis not present

## 2018-07-11 NOTE — Progress Notes (Signed)
   Subjective:  Patient presents today status post tarsal exostectomy right. DOS: 06/29/2018. She states she is doing well. She reports some pain that has been controlled with Motrin. She states the pain is worse at night but does not note any specific modifying factors. She has been using the CAM boot as directed. Patient is here for further evaluation and treatment.    Past Medical History:  Diagnosis Date  . Anxiety   . Complication of anesthesia   . Hx MRSA infection   . Hypertension   . PONV (postoperative nausea and vomiting)       Objective/Physical Exam Neurovascular status intact.  Skin incisions appear to be well coapted with sutures and staples intact. No sign of infectious process noted. No dehiscence. No active bleeding noted. Moderate edema noted to the surgical extremity.  Radiographic Exam:  Orthopedic hardware and osteotomies sites appear to be stable with routine healing.  Assessment: 1. s/p tarsal exostectomy right. DOS: 06/29/2018   Plan of Care:  1. Patient was evaluated. X-rays reviewed 2. Dressing changed.  3. Continue weightbearing in CAM boot.  4. Return to clinic in one week for suture removal.    Felecia Shelling, DPM [facility]  Dr. Felecia Shelling, DPM    [address]                                        [place], [place] [address]                Office [phone]  Fax [phone]

## 2018-07-18 ENCOUNTER — Encounter: Payer: Self-pay | Admitting: Podiatry

## 2018-07-18 ENCOUNTER — Ambulatory Visit (INDEPENDENT_AMBULATORY_CARE_PROVIDER_SITE_OTHER): Payer: Medicaid Other | Admitting: Podiatry

## 2018-07-18 ENCOUNTER — Other Ambulatory Visit: Payer: Self-pay

## 2018-07-18 VITALS — Temp 99.0°F

## 2018-07-18 DIAGNOSIS — M7751 Other enthesopathy of right foot: Secondary | ICD-10-CM

## 2018-07-18 DIAGNOSIS — Z9889 Other specified postprocedural states: Secondary | ICD-10-CM

## 2018-07-18 NOTE — Progress Notes (Signed)
   Subjective:  Patient presents today status post tarsal exostectomy right. DOS: 06/29/2018. She states she is doing well.  She has been using the CAM boot as directed. Patient is here for suture removal and postop care    Past Medical History:  Diagnosis Date  . Anxiety   . Complication of anesthesia   . Hx MRSA infection   . Hypertension   . PONV (postoperative nausea and vomiting)       Objective/Physical Exam Neurovascular status intact.  Skin incisions appear to be well coapted with sutures intact. No sign of infectious process noted. No dehiscence. No active bleeding noted. Minimal edema noted to the surgical extremity.  Assessment: 1. s/p tarsal exostectomy right. DOS: 06/29/2018   Plan of Care:  1. Patient was evaluated.  2. Sutures removed. Dressing changed.  3. Discontinue CAM. Postoperative shoe dispensed.  4. Patient can transition into tennis shoes.  5. Note provided to return to work.  6. RTC 4 weeks  Felecia Shelling, DPM [facility]  Dr. Felecia Shelling, DPM    [address]                                        [place], [place] [address]                Office [phone]  Fax [phone]

## 2018-08-15 ENCOUNTER — Encounter: Payer: Self-pay | Admitting: Podiatry

## 2018-08-15 ENCOUNTER — Other Ambulatory Visit: Payer: Self-pay

## 2018-08-15 ENCOUNTER — Ambulatory Visit (INDEPENDENT_AMBULATORY_CARE_PROVIDER_SITE_OTHER): Payer: Medicaid Other

## 2018-08-15 ENCOUNTER — Ambulatory Visit (INDEPENDENT_AMBULATORY_CARE_PROVIDER_SITE_OTHER): Payer: Medicaid Other | Admitting: Podiatry

## 2018-08-15 VITALS — Temp 98.7°F

## 2018-08-15 DIAGNOSIS — M7751 Other enthesopathy of right foot: Secondary | ICD-10-CM

## 2018-08-15 DIAGNOSIS — Z9889 Other specified postprocedural states: Secondary | ICD-10-CM

## 2018-08-15 MED ORDER — MELOXICAM 15 MG PO TABS: 15.0000 mg | ORAL_TABLET | Freq: Every day | ORAL | 0 refills | Status: AC

## 2018-08-15 NOTE — Progress Notes (Signed)
   Subjective:  Patient presents today status post tarsal exostectomy right. DOS: 06/29/2018.  Patient has been weightbearing and walking in tennis shoes and working with no complaints.  She does have some small residual achiness at times.  Otherwise no new complaints.  Past Medical History:  Diagnosis Date  . Anxiety   . Complication of anesthesia   . Hx MRSA infection   . Hypertension   . PONV (postoperative nausea and vomiting)       Objective/Physical Exam Neurovascular status intact.  Skin incisions appear to be well coapted. No sign of infectious process noted. No dehiscence. No active bleeding noted. Minimal edema noted to the surgical extremity.  Assessment: 1. s/p tarsal exostectomy right. DOS: 06/29/2018   Plan of Care:  1. Patient was evaluated.  2.  Patient may resume full activity no restrictions 3.  Recommend good supportive shoe gear 4.  Prescription for meloxicam 15 mg #30 5.  Return to clinic as needed   Felecia Shelling, DPM [facility]  Dr. Felecia Shelling, DPM    [address]                                        [place], [place] [address]                Office [phone]  Fax [phone]
